# Patient Record
Sex: Female | Born: 1935 | Race: Black or African American | Hispanic: No | Smoking: Former smoker
Health system: Southern US, Community
[De-identification: ages and names within clinical notes are randomized; demographics above are authoritative.]

## PROBLEM LIST (undated history)

## (undated) DIAGNOSIS — D509 Iron deficiency anemia, unspecified: Secondary | ICD-10-CM

## (undated) DIAGNOSIS — I10 Essential (primary) hypertension: Secondary | ICD-10-CM

## (undated) DIAGNOSIS — K5790 Diverticulosis of intestine, part unspecified, without perforation or abscess without bleeding: Secondary | ICD-10-CM

## (undated) DIAGNOSIS — E785 Hyperlipidemia, unspecified: Secondary | ICD-10-CM

## (undated) DIAGNOSIS — K635 Polyp of colon: Secondary | ICD-10-CM

## (undated) HISTORY — DX: Iron deficiency anemia, unspecified: D50.9

## (undated) HISTORY — DX: Polyp of colon: K63.5

## (undated) HISTORY — DX: Diverticulosis of intestine, part unspecified, without perforation or abscess without bleeding: K57.90

---

## 1968-06-28 HISTORY — PX: ABDOMINAL HYSTERECTOMY: SHX81

## 1997-10-25 ENCOUNTER — Ambulatory Visit (HOSPITAL_COMMUNITY): Admission: RE | Admit: 1997-10-25 | Discharge: 1997-10-25 | Payer: Self-pay | Admitting: Endocrinology

## 1997-11-12 ENCOUNTER — Other Ambulatory Visit: Admission: RE | Admit: 1997-11-12 | Discharge: 1997-11-12 | Payer: Self-pay | Admitting: Obstetrics and Gynecology

## 1998-12-01 ENCOUNTER — Other Ambulatory Visit: Admission: RE | Admit: 1998-12-01 | Discharge: 1998-12-01 | Payer: Self-pay | Admitting: Obstetrics and Gynecology

## 1999-09-08 ENCOUNTER — Ambulatory Visit (HOSPITAL_COMMUNITY): Admission: RE | Admit: 1999-09-08 | Discharge: 1999-09-08 | Payer: Self-pay | Admitting: Endocrinology

## 1999-09-08 ENCOUNTER — Encounter: Payer: Self-pay | Admitting: Endocrinology

## 1999-12-07 ENCOUNTER — Other Ambulatory Visit: Admission: RE | Admit: 1999-12-07 | Discharge: 1999-12-07 | Payer: Self-pay | Admitting: Obstetrics and Gynecology

## 2000-09-09 ENCOUNTER — Ambulatory Visit (HOSPITAL_COMMUNITY): Admission: RE | Admit: 2000-09-09 | Discharge: 2000-09-09 | Payer: Self-pay | Admitting: Obstetrics and Gynecology

## 2000-09-09 ENCOUNTER — Encounter: Payer: Self-pay | Admitting: Obstetrics and Gynecology

## 2000-12-05 ENCOUNTER — Other Ambulatory Visit: Admission: RE | Admit: 2000-12-05 | Discharge: 2000-12-05 | Payer: Self-pay | Admitting: Obstetrics and Gynecology

## 2001-07-03 ENCOUNTER — Encounter: Admission: RE | Admit: 2001-07-03 | Discharge: 2001-10-01 | Payer: Self-pay | Admitting: Endocrinology

## 2001-09-11 ENCOUNTER — Encounter: Payer: Self-pay | Admitting: Obstetrics and Gynecology

## 2001-09-11 ENCOUNTER — Ambulatory Visit (HOSPITAL_COMMUNITY): Admission: RE | Admit: 2001-09-11 | Discharge: 2001-09-11 | Payer: Self-pay | Admitting: Obstetrics and Gynecology

## 2001-10-05 ENCOUNTER — Encounter: Admission: RE | Admit: 2001-10-05 | Discharge: 2002-01-03 | Payer: Self-pay | Admitting: Endocrinology

## 2002-04-06 ENCOUNTER — Encounter: Admission: RE | Admit: 2002-04-06 | Discharge: 2002-04-06 | Payer: Self-pay | Admitting: Internal Medicine

## 2002-04-06 ENCOUNTER — Encounter: Payer: Self-pay | Admitting: Internal Medicine

## 2002-09-18 ENCOUNTER — Ambulatory Visit (HOSPITAL_COMMUNITY): Admission: RE | Admit: 2002-09-18 | Discharge: 2002-09-18 | Payer: Self-pay | Admitting: Endocrinology

## 2002-09-18 ENCOUNTER — Encounter: Payer: Self-pay | Admitting: Endocrinology

## 2003-10-01 ENCOUNTER — Ambulatory Visit (HOSPITAL_COMMUNITY): Admission: RE | Admit: 2003-10-01 | Discharge: 2003-10-01 | Payer: Self-pay | Admitting: Endocrinology

## 2004-10-07 ENCOUNTER — Ambulatory Visit (HOSPITAL_COMMUNITY): Admission: RE | Admit: 2004-10-07 | Discharge: 2004-10-07 | Payer: Self-pay | Admitting: Endocrinology

## 2005-10-12 ENCOUNTER — Ambulatory Visit (HOSPITAL_COMMUNITY): Admission: RE | Admit: 2005-10-12 | Discharge: 2005-10-12 | Payer: Self-pay | Admitting: Endocrinology

## 2006-10-18 ENCOUNTER — Ambulatory Visit (HOSPITAL_COMMUNITY): Admission: RE | Admit: 2006-10-18 | Discharge: 2006-10-18 | Payer: Self-pay | Admitting: Endocrinology

## 2007-10-18 ENCOUNTER — Ambulatory Visit (HOSPITAL_COMMUNITY): Admission: RE | Admit: 2007-10-18 | Discharge: 2007-10-18 | Payer: Self-pay | Admitting: Endocrinology

## 2008-10-21 ENCOUNTER — Ambulatory Visit (HOSPITAL_COMMUNITY): Admission: RE | Admit: 2008-10-21 | Discharge: 2008-10-21 | Payer: Self-pay | Admitting: Endocrinology

## 2009-03-11 ENCOUNTER — Encounter (INDEPENDENT_AMBULATORY_CARE_PROVIDER_SITE_OTHER): Payer: Self-pay | Admitting: *Deleted

## 2009-04-21 ENCOUNTER — Encounter (INDEPENDENT_AMBULATORY_CARE_PROVIDER_SITE_OTHER): Payer: Self-pay | Admitting: *Deleted

## 2009-04-30 ENCOUNTER — Ambulatory Visit: Payer: Self-pay | Admitting: Gastroenterology

## 2009-05-14 ENCOUNTER — Ambulatory Visit: Payer: Self-pay | Admitting: Gastroenterology

## 2009-05-21 ENCOUNTER — Encounter: Payer: Self-pay | Admitting: Gastroenterology

## 2009-10-24 ENCOUNTER — Ambulatory Visit (HOSPITAL_COMMUNITY): Admission: RE | Admit: 2009-10-24 | Discharge: 2009-10-24 | Payer: Self-pay | Admitting: Internal Medicine

## 2010-04-23 ENCOUNTER — Ambulatory Visit: Payer: Self-pay | Admitting: Gynecology

## 2010-04-23 ENCOUNTER — Other Ambulatory Visit: Admission: RE | Admit: 2010-04-23 | Discharge: 2010-04-23 | Payer: Self-pay | Admitting: Gynecology

## 2010-06-02 ENCOUNTER — Ambulatory Visit: Payer: Self-pay | Admitting: Gynecology

## 2010-07-19 ENCOUNTER — Encounter: Payer: Self-pay | Admitting: Endocrinology

## 2010-09-30 LAB — GLUCOSE, CAPILLARY: Glucose-Capillary: 81 mg/dL (ref 70–99)

## 2010-10-20 ENCOUNTER — Other Ambulatory Visit (HOSPITAL_COMMUNITY): Payer: Self-pay | Admitting: Internal Medicine

## 2010-10-20 DIAGNOSIS — Z1231 Encounter for screening mammogram for malignant neoplasm of breast: Secondary | ICD-10-CM

## 2010-10-21 ENCOUNTER — Other Ambulatory Visit: Payer: Self-pay | Admitting: Dermatology

## 2010-10-29 ENCOUNTER — Ambulatory Visit (HOSPITAL_COMMUNITY): Payer: Self-pay

## 2010-11-05 ENCOUNTER — Ambulatory Visit (HOSPITAL_COMMUNITY)
Admission: RE | Admit: 2010-11-05 | Discharge: 2010-11-05 | Disposition: A | Discharge status: Home / Self Care | Payer: Medicare Other | Source: Ambulatory Visit | Attending: Internal Medicine | Admitting: Internal Medicine

## 2010-11-05 DIAGNOSIS — Z1231 Encounter for screening mammogram for malignant neoplasm of breast: Secondary | ICD-10-CM

## 2011-08-02 DIAGNOSIS — Z0000 Encounter for general adult medical examination without abnormal findings: Secondary | ICD-10-CM | POA: Insufficient documentation

## 2011-10-04 ENCOUNTER — Other Ambulatory Visit (HOSPITAL_COMMUNITY): Payer: Self-pay | Admitting: Endocrinology

## 2011-10-04 DIAGNOSIS — Z1231 Encounter for screening mammogram for malignant neoplasm of breast: Secondary | ICD-10-CM

## 2011-10-26 ENCOUNTER — Emergency Department (HOSPITAL_COMMUNITY): Payer: Medicare Other

## 2011-10-26 ENCOUNTER — Encounter (HOSPITAL_COMMUNITY): Payer: Self-pay | Admitting: Emergency Medicine

## 2011-10-26 ENCOUNTER — Emergency Department (HOSPITAL_COMMUNITY)
Admission: EM | Admit: 2011-10-26 | Discharge: 2011-10-26 | Disposition: A | Discharge status: Home / Self Care | Payer: Medicare Other | Attending: Emergency Medicine | Admitting: Emergency Medicine

## 2011-10-26 DIAGNOSIS — Z79899 Other long term (current) drug therapy: Secondary | ICD-10-CM | POA: Insufficient documentation

## 2011-10-26 DIAGNOSIS — R51 Headache: Secondary | ICD-10-CM

## 2011-10-26 DIAGNOSIS — I10 Essential (primary) hypertension: Secondary | ICD-10-CM | POA: Insufficient documentation

## 2011-10-26 DIAGNOSIS — E785 Hyperlipidemia, unspecified: Secondary | ICD-10-CM | POA: Insufficient documentation

## 2011-10-26 DIAGNOSIS — E119 Type 2 diabetes mellitus without complications: Secondary | ICD-10-CM | POA: Insufficient documentation

## 2011-10-26 DIAGNOSIS — R6889 Other general symptoms and signs: Secondary | ICD-10-CM | POA: Insufficient documentation

## 2011-10-26 DIAGNOSIS — Z7982 Long term (current) use of aspirin: Secondary | ICD-10-CM | POA: Insufficient documentation

## 2011-10-26 DIAGNOSIS — J3489 Other specified disorders of nose and nasal sinuses: Secondary | ICD-10-CM | POA: Insufficient documentation

## 2011-10-26 HISTORY — DX: Hyperlipidemia, unspecified: E78.5

## 2011-10-26 HISTORY — DX: Essential (primary) hypertension: I10

## 2011-10-26 NOTE — ED Notes (Signed)
Pt states that she has HA over L eyebrow for past 2 weeks.   No sensitivity to light or movmement.  Only thing that helps pain is ibuprofen which works for 5 hours.  Denies eye strain.  States she has had pain over eyebrow before, but it has not lasted this long before.  Went to urgent care and was diagnosed with sinusitis, and was given amoxicillin-clavulanate.  Pt has taken all of abx with no relief.  Talked to PCP.  Was told if unable to get relief with abx, she needed to have a "scan."  Called pcp after abx completed, but has not gotten response from pcp.

## 2011-10-26 NOTE — ED Provider Notes (Signed)
History     CSN: [id number]  Arrival date & time 10/26/11  0712   First MD Initiated Contact with Patient 10/26/11 [phone]      Chief Complaint  Patient presents with  . Headache    (Consider location/radiation/quality/duration/timing/severity/associated sxs/prior treatment) HPI Comments: Maureen Walker is a 78 y.o. Female   who has had a headache for 2 weeks. It is, in the left frontal area. It is mild. She's not taken analgesic medication. Her PCP  gave her an antibiotic. This has not helped. She has sinus congestion in sneezing, without cough, shortness of breath, sputum production, fever, nausea, vomiting, chest pain. She's been eating well.  The history is provided by the patient.    Past Medical History  Diagnosis Date  . Diabetes mellitus   . Hyperlipidemia   . Hypertension     History reviewed. No pertinent past surgical history.  History reviewed. No pertinent family history.  History  Substance Use Topics  . Smoking status: Never Smoker   . Smokeless tobacco: Not on file  . Alcohol Use: No    OB History    Grav Para Term Preterm Abortions TAB SAB Ect Mult Living                  Review of Systems  All other systems reviewed and are negative.    Allergies  Review of patient's allergies indicates no known allergies.  Home Medications   Current Outpatient Rx  Name Route Sig Dispense Refill  . AMOXICILLIN-POT CLAVULANATE 875-125 MG PO TABS Oral Take 1 tablet by mouth 2 (two) times daily. 10 day supply completed on this past friday    . ASPIRIN EC 81 MG PO TBEC Oral Take 81 mg by mouth daily.    . ATORVASTATIN CALCIUM 40 MG PO TABS Oral Take 40 mg by mouth daily.    Marland Kitchen CALCIUM 600/VITAMIN D PO Oral Take 1 tablet by mouth daily.    . CO Q 10 100 MG PO CAPS Oral Take 1 tablet by mouth daily.    Marland Kitchen EZETIMIBE 10 MG PO TABS Oral Take 10 mg by mouth daily.    Marland Kitchen HYDROCHLOROTHIAZIDE 12.5 MG PO CAPS Oral Take 12.5 mg by mouth daily.    . IBUPROFEN 200 MG PO  TABS Oral Take 400 mg by mouth every 6 (six) hours as needed. pain    . METFORMIN HCL ER 750 MG PO TB24 Oral Take 750 mg by mouth daily with breakfast.    . ADULT MULTIVITAMIN W/MINERALS CH Oral Take 1 tablet by mouth daily.    Marland Kitchen PIOGLITAZONE HCL 45 MG PO TABS Oral Take 45 mg by mouth daily.    . QUINAPRIL HCL 40 MG PO TABS Oral Take 40 mg by mouth daily.      BP 145/84  Pulse 70  Temp(Src) 98.1 F (36.7 C) (Oral)  Resp 18  SpO2 96%  Physical Exam  Nursing note and vitals reviewed. Constitutional: She is oriented to person, place, and time. She appears well-developed and well-nourished.  HENT:  Head: Normocephalic and atraumatic.  Eyes: Conjunctivae and EOM are normal. Pupils are equal, round, and reactive to light.  Neck: Normal range of motion and phonation normal. Neck supple.  Cardiovascular: Normal rate, regular rhythm and intact distal pulses.   Pulmonary/Chest: Effort normal and breath sounds normal. She exhibits no tenderness.  Abdominal: Soft. She exhibits no distension. There is no tenderness. There is no guarding.  Musculoskeletal: Normal range of motion.  Neurological: She is alert and oriented to person, place, and time. She has normal strength. No cranial nerve deficit. She exhibits normal muscle tone. Coordination normal.       Normal gait  Skin: Skin is warm and dry.  Psychiatric: She has a normal mood and affect. Her behavior is normal. Judgment and thought content normal.    ED Course  Procedures (including critical care time)  Labs Reviewed - No data to display Ct Head Wo Contrast  10/26/2011  *RADIOLOGY REPORT*  Clinical Data: The history of a frontal headache for the past 2 weeks.  No history of trauma.  CT HEAD WITHOUT CONTRAST  Technique:  Contiguous axial images were obtained from the base of the skull through the vertex without contrast.  Comparison: No priors.  Findings: No acute intracranial abnormalities.  Specifically, no evidence of focal mass, mass  effect, hydrocephalus or abnormal intra or extra-axial fluid collections.  There is mild cerebral and cerebellar atrophy.  No acute displaced skull fractures are identified.  Visualized paranasal sinuses and mastoids are well pneumatized.  IMPRESSION: 1.  No acute intracranial abnormalities to account for the patient's symptoms. 2.  Mild cerebral and cerebellar atrophy.  Original Report Authenticated By: Florencia Reasons, M.D.     1. Headache       MDM  Nonspecific headache. Possible sinus congestion, tension headache or nonspecific myalgia. Patient stable for discharge. Doubt meningitis, subarachnoid hemorrhage, acute, recurrent sinusitis or metabolic instability.  Plan: Home Medications- Advil prn pain; Home Treatments- Rest; Recommended follow up- PCP prn        Flint Melter, MD 10/26/11 [phone]

## 2011-10-26 NOTE — Discharge Instructions (Signed)
  Use Advil, 3 times a day as needed for headache. See your doctor if not better in one week.  Headache, General, Unknown Cause The specific cause of your headache may not have been found today. There are many causes and types of headache. A few common ones are:  Tension headache.   Migraine.   Infections (examples: dental and sinus infections).   Bone and/or joint problems in the neck or jaw.   Depression.   Eye problems.  These headaches are not life threatening.  Headaches can sometimes be diagnosed by a patient history and a physical exam. Sometimes, lab and imaging studies (such as x-ray and/or CT scan) are used to rule out more serious problems. In some cases, a spinal tap (lumbar puncture) may be requested. There are many times when your exam and tests may be normal on the first visit even when there is a serious problem causing your headaches. Because of that, it is very important to follow up with your doctor or local clinic for further evaluation. FINDING OUT THE RESULTS OF TESTS  If a radiology test was performed, a radiologist will review your results.   You will be contacted by the emergency department or your physician if any test results require a change in your treatment plan.   Not all test results may be available during your visit. If your test results are not back during the visit, make an appointment with your caregiver to find out the results. Do not assume everything is normal if you have not heard from your caregiver or the medical facility. It is important for you to follow up on all of your test results.  HOME CARE INSTRUCTIONS   Keep follow-up appointments with your caregiver, or any specialist referral.   Only take over-the-counter or prescription medicines for pain, discomfort, or fever as directed by your caregiver.   Biofeedback, massage, or other relaxation techniques may be helpful.   Ice packs or heat applied to the head and neck can be used. Do this  three to four times per day, or as needed.   Call your doctor if you have any questions or concerns.   If you smoke, you should quit.  SEEK MEDICAL CARE IF:   You develop problems with medications prescribed.   You do not respond to or obtain relief from medications.   You have a change from the usual headache.   You develop nausea or vomiting.  SEEK IMMEDIATE MEDICAL CARE IF:   If your headache becomes severe.   You have an unexplained oral temperature above 102 F (38.9 C), or as your caregiver suggests.   You have a stiff neck.   You have loss of vision.   You have muscular weakness.   You have loss of muscular control.   You develop severe symptoms different from your first symptoms.   You start losing your balance or have trouble walking.   You feel faint or pass out.  MAKE SURE YOU:   Understand these instructions.   Will watch your condition.   Will get help right away if you are not doing well or get worse.  Document Released: 06/14/2005 Document Revised: 06/03/2011 Document Reviewed: 02/01/2008 [facility] Patient Information 2012 [place], [place].

## 2011-11-11 ENCOUNTER — Ambulatory Visit (HOSPITAL_COMMUNITY): Payer: Medicare Other

## 2011-12-01 ENCOUNTER — Ambulatory Visit (HOSPITAL_COMMUNITY)
Admission: RE | Admit: 2011-12-01 | Discharge: 2011-12-01 | Disposition: A | Discharge status: Home / Self Care | Payer: Medicare Other | Source: Ambulatory Visit | Attending: Endocrinology | Admitting: Endocrinology

## 2011-12-01 DIAGNOSIS — Z1231 Encounter for screening mammogram for malignant neoplasm of breast: Secondary | ICD-10-CM | POA: Insufficient documentation

## 2011-12-27 ENCOUNTER — Encounter (HOSPITAL_COMMUNITY): Payer: Self-pay | Admitting: *Deleted

## 2011-12-27 ENCOUNTER — Emergency Department (HOSPITAL_COMMUNITY)
Admission: EM | Admit: 2011-12-27 | Discharge: 2011-12-27 | Disposition: A | Discharge status: Home / Self Care | Payer: Medicare Other | Attending: Emergency Medicine | Admitting: Emergency Medicine

## 2011-12-27 ENCOUNTER — Emergency Department (HOSPITAL_COMMUNITY): Payer: Medicare Other

## 2011-12-27 DIAGNOSIS — E785 Hyperlipidemia, unspecified: Secondary | ICD-10-CM | POA: Insufficient documentation

## 2011-12-27 DIAGNOSIS — Z7982 Long term (current) use of aspirin: Secondary | ICD-10-CM | POA: Insufficient documentation

## 2011-12-27 DIAGNOSIS — E119 Type 2 diabetes mellitus without complications: Secondary | ICD-10-CM | POA: Insufficient documentation

## 2011-12-27 DIAGNOSIS — I10 Essential (primary) hypertension: Secondary | ICD-10-CM | POA: Insufficient documentation

## 2011-12-27 DIAGNOSIS — S91312A Laceration without foreign body, left foot, initial encounter: Secondary | ICD-10-CM

## 2011-12-27 DIAGNOSIS — W208XXA Other cause of strike by thrown, projected or falling object, initial encounter: Secondary | ICD-10-CM | POA: Insufficient documentation

## 2011-12-27 DIAGNOSIS — Z79899 Other long term (current) drug therapy: Secondary | ICD-10-CM | POA: Insufficient documentation

## 2011-12-27 DIAGNOSIS — S91309A Unspecified open wound, unspecified foot, initial encounter: Secondary | ICD-10-CM | POA: Insufficient documentation

## 2011-12-27 LAB — GLUCOSE, CAPILLARY: Glucose-Capillary: 125 mg/dL — ABNORMAL HIGH (ref 70–99)

## 2011-12-27 MED ORDER — TETANUS-DIPHTH-ACELL PERTUSSIS 5-2.5-18.5 LF-MCG/0.5 IM SUSP
0.5000 mL | Freq: Once | INTRAMUSCULAR | Status: AC
  Administered 2011-12-27: 0.5 mL via INTRAMUSCULAR
  Filled 2011-12-27: qty 0.5

## 2011-12-27 NOTE — Discharge Instructions (Signed)
Your wound has been repaired with sutures. Please keep the area clean and dry. You can wash the area as normal but do not scrub at the stitches. Your sutures will need to be removed in 8-10 days. You can follow up with your primary care doctor, at urgent care, or in the ED for this. When the stitches come out, apply vitamin E, cocoa butter, or Mederma to help with scarring. Use extra sunscreen on the scar when out in the sun to avoid the scar darkening. If you notice pus from the wound, redness of the surrounding skin, fever, swelling, or other concerns about worsening condition, return to the ED.  Laceration Care, Adult A laceration is a cut that goes through all layers of the skin. The cut goes into the tissue beneath the skin. HOME CARE For stitches (sutures) or staples:  Keep the cut clean and dry.   If you have a bandage (dressing), change it at least once a day. Change the bandage if it gets wet or dirty, or as told by your doctor.   Wash the cut with soap and water 2 times a day. Rinse the cut with water. Pat it dry with a clean towel.   Put a thin layer of medicated cream on the cut as told by your doctor.   You may shower after the first 24 hours. Do not soak the cut in water until the stitches are removed.   Only take medicines as told by your doctor.   Have your stitches or staples removed as told by your doctor.  For skin adhesive strips:  Keep the cut clean and dry.   Do not get the strips wet. You may take a bath, but be careful to keep the cut dry.   If the cut gets wet, pat it dry with a clean towel.   The strips will fall off on their own. Do not remove the strips that are still stuck to the cut.  For wound glue:  You may shower or take baths. Do not soak or scrub the cut. Do not swim. Avoid heavy sweating until the glue falls off on its own. After a shower or bath, pat the cut dry with a clean towel.   Do not put medicine on your cut until the glue falls off.   If  you have a bandage, do not put tape over the glue.   Avoid lots of sunlight or tanning lamps until the glue falls off. Put sunscreen on the cut for the first year to reduce your scar.   The glue will fall off on its own. Do not pick at the glue.  You may need a tetanus shot if:  You cannot remember when you had your last tetanus shot.   You have never had a tetanus shot.  If you need a tetanus shot and you choose not to have one, you may get tetanus. Sickness from tetanus can be serious. GET HELP RIGHT AWAY IF:   Your pain does not get better with medicine.   Your arm, hand, leg, or foot loses feeling (numbness) or changes color.   Your cut is bleeding.   Your joint feels weak, or you cannot use your joint.   You have painful lumps on your body.   Your cut is red, puffy (swollen), or painful.   You have a red line on the skin near the cut.   You have yellowish-white fluid (pus) coming from the cut.   You  have a fever.   You have a bad smell coming from the cut or bandage.   Your cut breaks open before or after stitches are removed.   You notice something coming out of the cut, such as wood or glass.   You cannot move a finger or toe.  MAKE SURE YOU:   Understand these instructions.   Will watch your condition.   Will get help right away if you are not doing well or get worse.  Document Released: 12/01/2007 Document Revised: 06/03/2011 Document Reviewed: 12/08/2010 [facility] Patient Information 2012 [place], [place].

## 2011-12-27 NOTE — ED Notes (Signed)
Pt reports heavy wooden board fell on left foot today. Laceration present, bleeding controlled prior to arrival. States metal on board cut foot. Pt reports last tetanus 6-7 years ago. Denies pain, states extremity "sore".

## 2011-12-27 NOTE — ED Provider Notes (Signed)
Medical screening examination/treatment/procedure(s) were conducted as a shared visit with non-physician practitioner(s) and myself.  I personally evaluated the patient during the encounter  Pt tolerated her procedure well.  She has no concerns at this time.  Will dc home.  Instructed her to monitor for signs of infection.  Prophylactic abx not necessary at this time.  Celene Kras, MD 12/27/11 2027

## 2011-12-27 NOTE — ED Provider Notes (Signed)
History     CSN: [id number]  Arrival date & time 12/27/11  1656   First MD Initiated Contact with Patient 12/27/11 1910      Chief Complaint  Patient presents with  . Extremity Laceration    (Consider location/radiation/quality/duration/timing/severity/associated sxs/prior treatment) HPI Comments: Pt reports a heavy piece of lumber fell against her left leg today. She suffered 2 lacerations to the dorsum of her L foot and an abrasion down her L thigh. She was able to control bleeding well at home with dressing; not on any anticoagulants. Hx DM but BGs well controlled, last A1c in low 6s. Last tet ~7 years ago.  Patient is a 78 y.o. female presenting with skin laceration. The history is provided by the patient.  Laceration  The incident occurred 3 to 5 hours ago. The laceration is located on the left foot. The laceration is 2 cm in size. The pain is mild. She reports no foreign bodies present.    Past Medical History  Diagnosis Date  . Diabetes mellitus   . Hyperlipidemia   . Hypertension     History reviewed. No pertinent past surgical history.  No family history on file.  History  Substance Use Topics  . Smoking status: Never Smoker   . Smokeless tobacco: Not on file  . Alcohol Use: No    OB History    Grav Para Term Preterm Abortions TAB SAB Ect Mult Living                  Review of Systems  Constitutional: Negative.   Musculoskeletal: Positive for myalgias.  Skin: Positive for wound.  Neurological: Negative for weakness and numbness.  Hematological: Does not bruise/bleed easily.    Allergies  Review of patient's allergies indicates no known allergies.  Home Medications   Current Outpatient Rx  Name Route Sig Dispense Refill  . ASPIRIN EC 81 MG PO TBEC Oral Take 81 mg by mouth daily.    . ATORVASTATIN CALCIUM 40 MG PO TABS Oral Take 40 mg by mouth daily.    Marland Kitchen CALCIUM 600/VITAMIN D PO Oral Take 1 tablet by mouth daily.    . CO Q 10 100 MG PO CAPS Oral  Take 1 tablet by mouth daily.    Marland Kitchen EZETIMIBE 10 MG PO TABS Oral Take 10 mg by mouth daily.    Marland Kitchen GABAPENTIN 300 MG PO CAPS Oral Take 300 mg by mouth at bedtime. May step up to bid if needed.    Marland Kitchen HYDROCHLOROTHIAZIDE 12.5 MG PO CAPS Oral Take 12.5 mg by mouth daily.    Marland Kitchen METFORMIN HCL ER 750 MG PO TB24 Oral Take 750 mg by mouth 2 (two) times daily.     . ADULT MULTIVITAMIN W/MINERALS CH Oral Take 1 tablet by mouth daily.    Marland Kitchen PIOGLITAZONE HCL 45 MG PO TABS Oral Take 45 mg by mouth daily.    . QUINAPRIL HCL 40 MG PO TABS Oral Take 40 mg by mouth daily.      BP 130/57  Pulse 67  Temp 98.3 F (36.8 C) (Oral)  Resp 16  SpO2 97%  Physical Exam  Nursing note and vitals reviewed. Constitutional: She appears well-developed and well-nourished. No distress.  HENT:  Head: Normocephalic and atraumatic.  Neck: Normal range of motion.  Cardiovascular: Normal rate.   Pulmonary/Chest: Effort normal.  Musculoskeletal: Normal range of motion.       Feet:       Skin tear to lat dorsum of  L foot, skin tear to medial dorsum with ~2 to 2.5 cm portion with deeper laceration; wound clean appearing, bleeding well controlled. Foot NVI distally with good pedal pulses. Cap refill <2.  Superficial vertical abrasion down L thigh, no active bleeding  Neurological: She is alert.  Skin: Skin is warm and dry. She is not diaphoretic.  Psychiatric: She has a normal mood and affect.    ED Course  Procedures (including critical care time)  LACERATION REPAIR Performed by: Grant Fontana Authorized by: Grant Fontana Consent: Verbal consent obtained. Risks and benefits: risks, benefits and alternatives were discussed Consent given by: patient Patient identity confirmed: provided demographic data Prepped and Draped in normal sterile fashion Wound explored  Laceration Location: L medial dorsum of foot  Laceration Length: 2.5cm  No Foreign Bodies seen or palpated  Anesthesia: local  infiltration  Local anesthetic: lidocaine 2% without epinephrine  Anesthetic total: 5 ml  Irrigation method: syringe Amount of cleaning: standard  Skin closure: 5-0 prolene  Number of sutures: 6  Technique: simple interrupted  Patient tolerance: Patient tolerated the procedure well with no immediate complications.  Labs Reviewed  GLUCOSE, CAPILLARY - Abnormal; Notable for the following:    Glucose-Capillary 125 (*)     All other components within normal limits   Dg Foot Complete Left  12/27/2011  *RADIOLOGY REPORT*  Clinical Data: Fall, foot laceration, pain.  LEFT FOOT - COMPLETE 3+ VIEW  Comparison: None.  Findings: Soft tissue swelling and laceration along the dorsum of the foot.  No radiopaque foreign body.  No acute bony abnormality. No fracture, subluxation or dislocation.  IMPRESSION: Dorsal soft tissue laceration.  No acute bony abnormality or radiopaque foreign body.  Original Report Authenticated By: Cyndie Chime, M.D.     1. Laceration of left foot       MDM  Pt with laceration to foot after a board fell on it. Lat lac appears to be a skin tear, medial lac is deeper and requires sutures. Wound cleaned well and sutures placed which pt tolerated well. Tet updated. Pt instructed to f/u with pcp to have sutures removed. Wound care discussed. She verbalized understanding and agreed to plan.        Grant Fontana, PA-C 12/27/11 2048

## 2012-01-03 ENCOUNTER — Emergency Department (HOSPITAL_COMMUNITY)
Admission: EM | Admit: 2012-01-03 | Discharge: 2012-01-03 | Disposition: A | Discharge status: Home / Self Care | Payer: Medicare Other | Attending: Emergency Medicine | Admitting: Emergency Medicine

## 2012-01-03 ENCOUNTER — Encounter (HOSPITAL_COMMUNITY): Payer: Self-pay | Admitting: Emergency Medicine

## 2012-01-03 DIAGNOSIS — Z79899 Other long term (current) drug therapy: Secondary | ICD-10-CM | POA: Insufficient documentation

## 2012-01-03 DIAGNOSIS — IMO0002 Reserved for concepts with insufficient information to code with codable children: Secondary | ICD-10-CM

## 2012-01-03 DIAGNOSIS — M7989 Other specified soft tissue disorders: Secondary | ICD-10-CM | POA: Insufficient documentation

## 2012-01-03 DIAGNOSIS — E785 Hyperlipidemia, unspecified: Secondary | ICD-10-CM | POA: Insufficient documentation

## 2012-01-03 DIAGNOSIS — E119 Type 2 diabetes mellitus without complications: Secondary | ICD-10-CM | POA: Insufficient documentation

## 2012-01-03 DIAGNOSIS — Z7982 Long term (current) use of aspirin: Secondary | ICD-10-CM | POA: Insufficient documentation

## 2012-01-03 DIAGNOSIS — I10 Essential (primary) hypertension: Secondary | ICD-10-CM | POA: Insufficient documentation

## 2012-01-03 LAB — BASIC METABOLIC PANEL
CO2: 29 mEq/L (ref 19–32)
Calcium: 9.8 mg/dL (ref 8.4–10.5)
Creatinine, Ser: 0.8 mg/dL (ref 0.50–1.10)
GFR calc Af Amer: 80 mL/min — ABNORMAL LOW (ref >90)
GFR calc non Af Amer: 69 mL/min — ABNORMAL LOW (ref >90)
Sodium: 131 mEq/L — ABNORMAL LOW (ref 135–145)

## 2012-01-03 LAB — CBC
MCH: 24.4 pg — ABNORMAL LOW (ref 26.0–34.0)
Platelets: 353 10*3/uL (ref 150–400)
RBC: 5.25 MIL/uL — ABNORMAL HIGH (ref 3.87–5.11)
RDW: 14.2 % (ref 11.5–15.5)

## 2012-01-03 LAB — GLUCOSE, CAPILLARY: Glucose-Capillary: 112 mg/dL — ABNORMAL HIGH (ref 70–99)

## 2012-01-03 MED ORDER — CEPHALEXIN 500 MG PO CAPS: 500.0000 mg | ORAL_CAPSULE | Freq: Three times a day (TID) | ORAL | Status: AC

## 2012-01-03 MED ORDER — CEFAZOLIN SODIUM 1-5 GM-% IV SOLN
1.0000 g | Freq: Once | INTRAVENOUS | Status: AC
  Administered 2012-01-03: 1 g via INTRAVENOUS
  Filled 2012-01-03: qty 50

## 2012-01-03 MED ORDER — BACITRACIN ZINC 500 UNIT/GM EX OINT
TOPICAL_OINTMENT | CUTANEOUS | Status: AC
  Filled 2012-01-03: qty 1.8

## 2012-01-03 NOTE — ED Notes (Signed)
Diabetic patient with wound to left foot.  Pt had six stitches put in her foot on Monday, after a board fell on her foot.  She returns now with redness and swelling to the area.

## 2012-01-03 NOTE — ED Notes (Signed)
Pt presenting to ed with c/o left foot with swelling and soreness pt states she noticed pus on her left foot pt states she was told to present back to ed for any pus. Pt states she is diabetic and was concerned. Pt states she has had the redness since she cut her foot. Pt states she was seen here in the ED when she first injured her foot x 7 days ago

## 2012-01-03 NOTE — ED Provider Notes (Signed)
History     CSN: [id number]  Arrival date & time 01/03/12  1548   First MD Initiated Contact with Patient 01/03/12 1717      Chief Complaint  Patient presents with  . Foot Problem   HPI Pt sustained a laceration to her foot last week.  She has been keeping a close watch of it because of her diabetes.  Today she noticed it looks more swollen and she thought she noticed a little bit of yellow drainage around the non sutured wound.  She has not had any pain or fevers.    No vomiting or diarrhea.  No other complaints.  The symptoms are mild.  Past Medical History  Diagnosis Date  . Diabetes mellitus   . Hyperlipidemia   . Hypertension     History reviewed. No pertinent past surgical history.  No family history on file.  History  Substance Use Topics  . Smoking status: Never Smoker   . Smokeless tobacco: Not on file  . Alcohol Use: No    OB History    Grav Para Term Preterm Abortions TAB SAB Ect Mult Living                  Review of Systems  All other systems reviewed and are negative.    Allergies  Review of patient's allergies indicates no known allergies.  Home Medications   Current Outpatient Rx  Name Route Sig Dispense Refill  . ASPIRIN EC 81 MG PO TBEC Oral Take 81 mg by mouth daily.    . ATORVASTATIN CALCIUM 40 MG PO TABS Oral Take 40 mg by mouth daily.    Marland Kitchen CALCIUM 600/VITAMIN D PO Oral Take 1 tablet by mouth daily.    . CO Q 10 100 MG PO CAPS Oral Take 1 tablet by mouth daily.    Marland Kitchen EZETIMIBE 10 MG PO TABS Oral Take 10 mg by mouth daily.    Marland Kitchen GABAPENTIN 300 MG PO CAPS Oral Take 300 mg by mouth at bedtime. May step up to bid if needed.    Marland Kitchen HYDROCHLOROTHIAZIDE 12.5 MG PO CAPS Oral Take 12.5 mg by mouth daily.    Marland Kitchen METFORMIN HCL ER 750 MG PO TB24 Oral Take 750 mg by mouth 2 (two) times daily.     . ADULT MULTIVITAMIN W/MINERALS CH Oral Take 1 tablet by mouth daily.    Marland Kitchen PIOGLITAZONE HCL 45 MG PO TABS Oral Take 45 mg by mouth daily.    . QUINAPRIL HCL 40 MG  PO TABS Oral Take 40 mg by mouth daily.      BP 128/60  Pulse 71  Temp 99.2 F (37.3 C) (Oral)  Resp 20  SpO2 98%  Physical Exam  Nursing note and vitals reviewed. Constitutional: She appears well-developed and well-nourished. No distress.  HENT:  Head: Normocephalic and atraumatic.  Right Ear: External ear normal.  Left Ear: External ear normal.  Eyes: Conjunctivae are normal. Right eye exhibits no discharge. Left eye exhibits no discharge. No scleral icterus.  Neck: Neck supple. No tracheal deviation present.  Cardiovascular: Normal rate, regular rhythm and intact distal pulses.   Pulmonary/Chest: Effort normal and breath sounds normal. No stridor. No respiratory distress. She has no wheezes. She has no rales.  Abdominal: Soft. Bowel sounds are normal. She exhibits no distension. There is no tenderness. There is no rebound and no guarding.  Musculoskeletal: She exhibits no edema and no tenderness.       Left foot with two  wounds, sutured wound with mild erythema, no ttp, no drainage, more proximal wound with granulation tissue at base and one are of white subcutaneous tissue, no purulent drainage, mild erythema around the wound  Neurological: She is alert. She has normal strength. No sensory deficit. Cranial nerve deficit:  no gross defecits noted. She exhibits normal muscle tone. She displays no seizure activity. Coordination normal.  Skin: Skin is warm and dry. No rash noted.  Psychiatric: She has a normal mood and affect.    ED Course  Procedures (including critical care time)  Labs Reviewed  GLUCOSE, CAPILLARY - Abnormal; Notable for the following:    Glucose-Capillary 112 (*)     All other components within normal limits  CBC - Abnormal; Notable for the following:    RBC 5.25 (*)     MCV 73.1 (*)     MCH 24.4 (*)     All other components within normal limits  BASIC METABOLIC PANEL - Abnormal; Notable for the following:    Sodium 131 (*)     Chloride 94 (*)     GFR  calc non Af Amer 69 (*)     GFR calc Af Amer 80 (*)     All other components within normal limits   No results found.   1. Laceration     MDM  I do not think that she definitely has cellulitis based on her exam. However, she has noticed some increased swelling. I will start her on a course of antibiotics. She has plans to followup with her doctor on Thursday. For now I told her we will keep the sutures in but she should monitor the area closely. If she notices increasing redness or streaking going up her leg she should return to the emergency room for evaluation.       Celene Kras, MD 01/03/12 1900

## 2012-04-10 ENCOUNTER — Encounter: Payer: Self-pay | Admitting: Gastroenterology

## 2012-05-10 ENCOUNTER — Other Ambulatory Visit (INDEPENDENT_AMBULATORY_CARE_PROVIDER_SITE_OTHER): Payer: Medicare Other

## 2012-05-10 ENCOUNTER — Encounter: Payer: Self-pay | Admitting: Gastroenterology

## 2012-05-10 ENCOUNTER — Ambulatory Visit (INDEPENDENT_AMBULATORY_CARE_PROVIDER_SITE_OTHER): Payer: Medicare Other | Admitting: Gastroenterology

## 2012-05-10 VITALS — BP 126/60 | HR 80 | Ht 65.0 in | Wt 180.2 lb

## 2012-05-10 DIAGNOSIS — K625 Hemorrhage of anus and rectum: Secondary | ICD-10-CM

## 2012-05-10 DIAGNOSIS — D649 Anemia, unspecified: Secondary | ICD-10-CM | POA: Insufficient documentation

## 2012-05-10 DIAGNOSIS — K921 Melena: Secondary | ICD-10-CM

## 2012-05-10 LAB — CBC WITH DIFFERENTIAL/PLATELET
Basophils Relative: 1.2 % (ref 0.0–3.0)
Eosinophils Relative: 1.8 % (ref 0.0–5.0)
Lymphocytes Relative: 29.8 % (ref 12.0–46.0)
MCV: 76.7 fl — ABNORMAL LOW (ref 78.0–100.0)
Monocytes Relative: 10.3 % (ref 3.0–12.0)
Neutrophils Relative %: 56.9 % (ref 43.0–77.0)
Platelets: 290 10*3/uL (ref 150.0–400.0)
RBC: 5.12 Mil/uL — ABNORMAL HIGH (ref 3.87–5.11)
WBC: 6.6 10*3/uL (ref 4.5–10.5)

## 2012-05-10 LAB — FERRITIN: Ferritin: 19.6 ng/mL (ref 10.0–291.0)

## 2012-05-10 LAB — IBC PANEL
Iron: 54 ug/dL (ref 42–145)
Saturation Ratios: 12.4 % — ABNORMAL LOW (ref 20.0–50.0)
Transferrin: 311.7 mg/dL (ref 212.0–360.0)

## 2012-05-10 NOTE — Patient Instructions (Addendum)
You will go to the basement today for labs You will be given a hemoccult kit that you will need to return within 2 weeks

## 2012-05-10 NOTE — Assessment & Plan Note (Signed)
Very limited rectal bleeding is most likely related to hemorrhoidal disease. Colonoscopy 2010 was negative except for diverticulosis and a hyperplastic polyp.  Recommendations #1 followup Hemoccults

## 2012-05-10 NOTE — Assessment & Plan Note (Signed)
She has a mild microcytic anemia.  Recommendations #1 check iron, TIBC and ferritin levels

## 2012-05-10 NOTE — Progress Notes (Signed)
History of Present Illness: This is a 78 year old Afro-American female referred at the request of Dr. Allyne Gee for evaluation of rectal bleeding. On a few occasions Maureen Walker has noticed a small amount of blood on the toilet tissue. Maureen Walker denies change in bowel habits, abdominal or rectal pain. Colooscopy in 2010 demonstrated a small hyperplastic polyp.  Blood work is pertinent for a hemoglobin of 12.8 and MCV 73.1 in July, 2013. Maureen Walker has a remote history of adenomatous polyps.    Past Medical History  Diagnosis Date  . Diabetes mellitus   . Hyperlipidemia   . Hypertension   . Diverticulosis   . Colon polyps     adenomatous polyps   Past Surgical History  Procedure Date  . Abdominal hysterectomy 1970   family history includes Breast cancer in her maternal aunt; Diabetes in her mother and sister; Heart disease in her paternal aunt; Melanoma in her father; and Stroke in her paternal grandmother. Current Outpatient Prescriptions  Medication Sig Dispense Refill  . aspirin EC 81 MG tablet Take 81 mg by mouth daily.      Marland Kitchen atorvastatin (LIPITOR) 40 MG tablet Take 40 mg by mouth daily.      . Calcium Carbonate-Vitamin D (CALCIUM 600/VITAMIN D PO) Take 1 tablet by mouth daily.      . Coenzyme Q10 (CO Q 10) 100 MG CAPS Take 1 tablet by mouth daily.      Marland Kitchen ezetimibe (ZETIA) 10 MG tablet Take 10 mg by mouth daily.      . hydrochlorothiazide (MICROZIDE) 12.5 MG capsule Take 12.5 mg by mouth daily.      . metFORMIN (GLUCOPHAGE-XR) 750 MG 24 hr tablet Take 750 mg by mouth 2 (two) times daily.       . Multiple Vitamin (MULITIVITAMIN WITH MINERALS) TABS Take 1 tablet by mouth daily.      . pioglitazone (ACTOS) 45 MG tablet Take 45 mg by mouth daily.      . quinapril (ACCUPRIL) 40 MG tablet Take 40 mg by mouth daily.       Allergies as of 05/10/2012  . (No Known Allergies)    reports that Maureen Walker has quit smoking. Her smoking use included Cigarettes. Maureen Walker has never used smokeless tobacco. Maureen Walker reports that Maureen Walker does  not drink alcohol or use illicit drugs.     Review of Systems: Pertinent positive and negative review of systems were noted in the above HPI section. All other review of systems were otherwise negative.  Vital signs were reviewed in today's medical record Physical Exam: General: Well developed , well nourished, no acute distress Head: Normocephalic and atraumatic Eyes:  sclerae anicteric, EOMI Ears: Normal auditory acuity Mouth: No deformity or lesions Neck: Supple, no masses or thyromegaly Lungs: Clear throughout to auscultation Heart: Regular rate and rhythm; no murmurs, rubs or bruits Abdomen: Soft, non tender and non distended. No masses, hepatosplenomegaly or hernias noted. Normal Bowel sounds Rectal: There are small external skin tags Musculoskeletal: Symmetrical with no gross deformities  Skin: No lesions on visible extremities Pulses:  Normal pulses noted Extremities: No clubbing, cyanosis, edema or deformities noted Neurological: Alert oriented x 4, grossly nonfocal Cervical Nodes:  No significant cervical adenopathy Inguinal Nodes: No significant inguinal adenopathy Psychological:  Alert and cooperative. Normal mood and affect

## 2012-05-16 ENCOUNTER — Other Ambulatory Visit (INDEPENDENT_AMBULATORY_CARE_PROVIDER_SITE_OTHER): Payer: Medicare Other

## 2012-05-16 DIAGNOSIS — K921 Melena: Secondary | ICD-10-CM

## 2012-05-16 LAB — FECAL OCCULT BLOOD, IMMUNOCHEMICAL: Fecal Occult Bld: NEGATIVE

## 2012-10-30 ENCOUNTER — Other Ambulatory Visit (HOSPITAL_COMMUNITY): Payer: Self-pay | Admitting: Endocrinology

## 2012-10-30 DIAGNOSIS — Z1231 Encounter for screening mammogram for malignant neoplasm of breast: Secondary | ICD-10-CM

## 2012-12-01 ENCOUNTER — Ambulatory Visit (HOSPITAL_COMMUNITY)
Admission: RE | Admit: 2012-12-01 | Discharge: 2012-12-01 | Disposition: A | Discharge status: Home / Self Care | Payer: Medicare PPO | Source: Ambulatory Visit | Attending: Endocrinology | Admitting: Endocrinology

## 2012-12-01 DIAGNOSIS — Z1231 Encounter for screening mammogram for malignant neoplasm of breast: Secondary | ICD-10-CM

## 2013-01-11 ENCOUNTER — Encounter: Payer: Self-pay | Admitting: Gynecology

## 2013-02-20 ENCOUNTER — Encounter: Payer: Self-pay | Admitting: Gynecology

## 2013-04-04 ENCOUNTER — Ambulatory Visit (INDEPENDENT_AMBULATORY_CARE_PROVIDER_SITE_OTHER): Payer: Medicare PPO | Admitting: Gynecology

## 2013-04-04 ENCOUNTER — Encounter: Payer: Self-pay | Admitting: Gynecology

## 2013-04-04 VITALS — BP 110/70 | Ht 64.75 in | Wt 179.8 lb

## 2013-04-04 DIAGNOSIS — N952 Postmenopausal atrophic vaginitis: Secondary | ICD-10-CM

## 2013-04-04 DIAGNOSIS — Z23 Encounter for immunization: Secondary | ICD-10-CM

## 2013-04-04 DIAGNOSIS — Z780 Asymptomatic menopausal state: Secondary | ICD-10-CM

## 2013-04-04 DIAGNOSIS — D239 Other benign neoplasm of skin, unspecified: Secondary | ICD-10-CM

## 2013-04-04 DIAGNOSIS — D229 Melanocytic nevi, unspecified: Secondary | ICD-10-CM

## 2013-04-04 DIAGNOSIS — N951 Menopausal and female climacteric states: Secondary | ICD-10-CM

## 2013-04-04 NOTE — Progress Notes (Signed)
Maureen Walker 21-Sep-1932 [id number]   History:    79 y.o.  Presents to the office for followup. Patient has not been seen in the office since 2011. Her PCP is Dr. Velna Hatchet and her endocrinologist at Dr. Everlean Patterson. Patient interested in a flu vaccine. Patient also complaining of a pigmented mole on the upper middle aspect of her left arm. She states has gotten a little bigger darker the past 2 years. Review of her records indicated she had a TAH RSO in 1970 as a result of a leiomyomatous uteri. Patient denies any prior history of abnormal Pap smear. Last bone density study 2011. Mammogram this year normal. Her Tdap and shingles vaccine are up to date. Her colonoscopy was reported to have been done in 2010 patient with prior history of colon polyps.    Past medical history,surgical history, family history and social history were all reviewed and documented in the EPIC chart.  Gynecologic History No LMP recorded. Patient has had a hysterectomy. Contraception: status post hysterectomy Last Pap: over 10 years ago. Results were: normal Last mammogram: 2014. Results were: normal  Obstetric History OB History  Gravida Para Term Preterm AB SAB TAB Ectopic Multiple Living  1    1 1     0    # Outcome Date GA Lbr Len/2nd Weight Sex Delivery Anes PTL Lv  1 SAB                ROS: A ROS was performed and pertinent positives and negatives are included in the history.  GENERAL: No fevers or chills. HEENT: No change in vision, no earache, sore throat or sinus congestion. NECK: No pain or stiffness. CARDIOVASCULAR: No chest pain or pressure. No palpitations. PULMONARY: No shortness of breath, cough or wheeze. GASTROINTESTINAL: No abdominal pain, nausea, vomiting or diarrhea, melena or bright red blood per rectum. GENITOURINARY: No urinary frequency, urgency, hesitancy or dysuria. MUSCULOSKELETAL: No joint or muscle pain, no back pain, no recent trauma. DERMATOLOGIC: No rash, no itching, no lesions.  ENDOCRINE: No polyuria, polydipsia, no heat or cold intolerance. No recent change in weight. HEMATOLOGICAL: No anemia or easy bruising or bleeding. NEUROLOGIC: No headache, seizures, numbness, tingling or weakness. PSYCHIATRIC: No depression, no loss of interest in normal activity or change in sleep pattern.     Exam: chaperone present  BP 110/70  Ht 5' 4.75" (1.645 m)  Wt 179 lb 12.8 oz (81.557 kg)  BMI 30.14 kg/m2  Body mass index is 30.14 kg/(m^2).  General appearance : Well developed well nourished female. No acute distress HEENT: Neck supple, trachea midline, no carotid bruits, no thyroidmegaly Lungs: Clear to auscultation, no rhonchi or wheezes, or rib retractions  Heart: Regular rate and rhythm, no murmurs or gallops Breast:Examined in sitting and supine position were symmetrical in appearance, no palpable masses or tenderness,  no skin retraction, no nipple inversion, no nipple discharge, no skin discoloration, no axillary or supraclavicular lymphadenopathy Abdomen: no palpable masses or tenderness, no rebound or guarding Extremities: no edema or skin discoloration or tenderness,left arm all  Pelvic:  Bartholin, Urethra, Skene Glands: Within normal limits             Vagina: No gross lesions or discharge  Cervix: absent  Uterus Absent  Adnexa  Without masses or tenderness  Anus and perineum  normal   Rectovaginal  normal sphincter tone without palpated masses or tenderness             Hemoccult cards provided  Assessment/Plan:  79 y.o. female with left arm mole will return to the office in a few weeks to remove and submitted for pathological evaluation. Patient received the flu vaccine today. We discussed importance of calcium vitamin D for osteoporosis prevention. She will also schedule her bone density study here in the office as well. Pap smear was not done in accordance to the new guidelines. Blood work by her PCP and endocrinologist. Hemoccult cards provided for the  patient is admitted to the office for testing at a later date.     Note: This dictation was prepared with  Dragon/digital dictation along withSmart phrase technology. Any transcriptional errors that result from this process are unintentional.   Ok Edwards MD, 12:49 PM 04/04/2013

## 2013-04-04 NOTE — Patient Instructions (Signed)
Bone Densitometry Bone densitometry is a special X-ray that measures your bone density and can be used to help predict your risk of bone fractures. This test is used to determine bone mineral content and density to diagnose osteoporosis. Osteoporosis is the loss of bone that may cause the bone to become weak. Osteoporosis commonly occurs in women entering menopause. However, it may be found in men and in people with other diseases. PREPARATION FOR TEST No preparation necessary. WHO SHOULD BE TESTED?  All women older than 65.  Postmenopausal women (50 to 65) with risk factors for osteoporosis.  People with a previous fracture caused by normal activities.  People with a small body frame (less than 127 poundsor a body mass index [BMI] of less than 21).  People who have a parent with a hip fracture or history of osteoporosis.  People who smoke.  People who have rheumatoid arthritis.  Anyone who engages in excessive alcohol use (more than 3 drinks most days).  Women who experience early menopause. WHEN SHOULD YOU BE RETESTED? Current guidelines suggest that you should wait at least 2 years before doing a bone density test again if your first test was normal.Recent studies indicated that women with normal bone density may be able to wait a few years before needing to repeat a bone density test. You should discuss this with your caregiver.  NORMAL FINDINGS   Normal: less than standard deviation below normal (greater than -1).  Osteopenia: 1 to 2.5 standard deviations below normal (-1 to -2.5).  Osteoporosis: greater than 2.5 standard deviations below normal (less than -2.5). Test results are reported as a "T score" and a "Z score."The T score is a number that compares your bone density with the bone density of healthy, young women.The Z score is a number that compares your bone density with the scores of women who are the same age, gender, and race.  Ranges for normal findings may vary  among different laboratories and hospitals. You should always check with your doctor after having lab work or other tests done to discuss the meaning of your test results and whether your values are considered within normal limits. MEANING OF TEST  Your caregiver will go over the test results with you and discuss the importance and meaning of your results, as well as treatment options and the need for additional tests if necessary. OBTAINING THE TEST RESULTS It is your responsibility to obtain your test results. Ask the lab or department performing the test when and how you will get your results. Document Released: 07/06/2004 Document Revised: 09/06/2011 Document Reviewed: 07/29/2010 [employer] Patient Information 2014 [employer].  

## 2013-04-10 ENCOUNTER — Encounter: Payer: Self-pay | Admitting: Gynecology

## 2013-04-23 ENCOUNTER — Other Ambulatory Visit: Payer: Medicare PPO | Admitting: Anesthesiology

## 2013-04-23 DIAGNOSIS — Z1211 Encounter for screening for malignant neoplasm of colon: Secondary | ICD-10-CM

## 2013-05-03 ENCOUNTER — Ambulatory Visit: Payer: Medicare PPO | Admitting: Gynecology

## 2013-11-01 ENCOUNTER — Encounter (INDEPENDENT_AMBULATORY_CARE_PROVIDER_SITE_OTHER): Payer: Self-pay | Admitting: Surgery

## 2013-11-05 ENCOUNTER — Other Ambulatory Visit (HOSPITAL_COMMUNITY): Payer: Self-pay | Admitting: Internal Medicine

## 2013-11-05 DIAGNOSIS — Z1231 Encounter for screening mammogram for malignant neoplasm of breast: Secondary | ICD-10-CM

## 2013-11-20 ENCOUNTER — Ambulatory Visit (INDEPENDENT_AMBULATORY_CARE_PROVIDER_SITE_OTHER): Payer: Medicare PPO | Admitting: Surgery

## 2013-12-12 ENCOUNTER — Ambulatory Visit (HOSPITAL_COMMUNITY)
Admission: RE | Admit: 2013-12-12 | Discharge: 2013-12-12 | Disposition: A | Discharge status: Home / Self Care | Payer: Medicare PPO | Source: Ambulatory Visit | Attending: Internal Medicine | Admitting: Internal Medicine

## 2013-12-12 DIAGNOSIS — Z1231 Encounter for screening mammogram for malignant neoplasm of breast: Secondary | ICD-10-CM | POA: Insufficient documentation

## 2014-04-29 ENCOUNTER — Encounter (INDEPENDENT_AMBULATORY_CARE_PROVIDER_SITE_OTHER): Payer: Self-pay | Admitting: Surgery

## 2014-12-03 ENCOUNTER — Other Ambulatory Visit (HOSPITAL_COMMUNITY): Payer: Self-pay | Admitting: Internal Medicine

## 2014-12-03 DIAGNOSIS — Z1231 Encounter for screening mammogram for malignant neoplasm of breast: Secondary | ICD-10-CM

## 2014-12-13 DIAGNOSIS — G43009 Migraine without aura, not intractable, without status migrainosus: Secondary | ICD-10-CM | POA: Diagnosis not present

## 2014-12-13 DIAGNOSIS — J329 Chronic sinusitis, unspecified: Secondary | ICD-10-CM | POA: Diagnosis not present

## 2014-12-19 ENCOUNTER — Ambulatory Visit (HOSPITAL_COMMUNITY)
Admission: RE | Admit: 2014-12-19 | Discharge: 2014-12-19 | Disposition: A | Discharge status: Home / Self Care | Payer: Medicare PPO | Source: Ambulatory Visit | Attending: Internal Medicine | Admitting: Internal Medicine

## 2014-12-19 DIAGNOSIS — Z1231 Encounter for screening mammogram for malignant neoplasm of breast: Secondary | ICD-10-CM | POA: Insufficient documentation

## 2014-12-24 ENCOUNTER — Other Ambulatory Visit: Payer: Self-pay | Admitting: Otolaryngology

## 2014-12-24 DIAGNOSIS — J329 Chronic sinusitis, unspecified: Secondary | ICD-10-CM

## 2014-12-24 DIAGNOSIS — R0981 Nasal congestion: Secondary | ICD-10-CM

## 2014-12-27 ENCOUNTER — Encounter: Payer: Self-pay | Admitting: Gastroenterology

## 2015-01-07 ENCOUNTER — Ambulatory Visit
Admission: RE | Admit: 2015-01-07 | Discharge: 2015-01-07 | Disposition: A | Discharge status: Home / Self Care | Payer: Medicare PPO | Source: Ambulatory Visit | Attending: Otolaryngology | Admitting: Otolaryngology

## 2015-01-07 DIAGNOSIS — R0981 Nasal congestion: Secondary | ICD-10-CM

## 2015-01-07 DIAGNOSIS — J329 Chronic sinusitis, unspecified: Secondary | ICD-10-CM | POA: Diagnosis not present

## 2015-01-14 DIAGNOSIS — Z049 Encounter for examination and observation for unspecified reason: Secondary | ICD-10-CM | POA: Diagnosis not present

## 2015-01-14 DIAGNOSIS — R51 Headache: Secondary | ICD-10-CM | POA: Diagnosis not present

## 2015-02-05 DIAGNOSIS — M791 Myalgia: Secondary | ICD-10-CM | POA: Diagnosis not present

## 2015-02-05 DIAGNOSIS — R51 Headache: Secondary | ICD-10-CM | POA: Diagnosis not present

## 2015-02-05 DIAGNOSIS — M542 Cervicalgia: Secondary | ICD-10-CM | POA: Diagnosis not present

## 2015-02-28 DIAGNOSIS — M791 Myalgia: Secondary | ICD-10-CM | POA: Diagnosis not present

## 2015-02-28 DIAGNOSIS — R51 Headache: Secondary | ICD-10-CM | POA: Diagnosis not present

## 2015-02-28 DIAGNOSIS — M542 Cervicalgia: Secondary | ICD-10-CM | POA: Diagnosis not present

## 2015-03-12 DIAGNOSIS — Z23 Encounter for immunization: Secondary | ICD-10-CM | POA: Diagnosis not present

## 2015-04-04 DIAGNOSIS — M542 Cervicalgia: Secondary | ICD-10-CM | POA: Diagnosis not present

## 2015-04-04 DIAGNOSIS — R51 Headache: Secondary | ICD-10-CM | POA: Diagnosis not present

## 2015-04-04 DIAGNOSIS — M791 Myalgia: Secondary | ICD-10-CM | POA: Diagnosis not present

## 2015-04-04 DIAGNOSIS — Z79899 Other long term (current) drug therapy: Secondary | ICD-10-CM | POA: Diagnosis not present

## 2015-04-23 DIAGNOSIS — R51 Headache: Secondary | ICD-10-CM | POA: Diagnosis not present

## 2015-04-23 DIAGNOSIS — M542 Cervicalgia: Secondary | ICD-10-CM | POA: Diagnosis not present

## 2015-04-23 DIAGNOSIS — M791 Myalgia: Secondary | ICD-10-CM | POA: Diagnosis not present

## 2015-04-30 DIAGNOSIS — E040 Nontoxic diffuse goiter: Secondary | ICD-10-CM | POA: Diagnosis not present

## 2015-04-30 DIAGNOSIS — E782 Mixed hyperlipidemia: Secondary | ICD-10-CM | POA: Diagnosis not present

## 2015-04-30 DIAGNOSIS — E119 Type 2 diabetes mellitus without complications: Secondary | ICD-10-CM | POA: Diagnosis not present

## 2015-04-30 DIAGNOSIS — E559 Vitamin D deficiency, unspecified: Secondary | ICD-10-CM | POA: Diagnosis not present

## 2015-05-02 DIAGNOSIS — E040 Nontoxic diffuse goiter: Secondary | ICD-10-CM | POA: Diagnosis not present

## 2015-05-02 DIAGNOSIS — E559 Vitamin D deficiency, unspecified: Secondary | ICD-10-CM | POA: Diagnosis not present

## 2015-05-02 DIAGNOSIS — I10 Essential (primary) hypertension: Secondary | ICD-10-CM | POA: Diagnosis not present

## 2015-05-02 DIAGNOSIS — E119 Type 2 diabetes mellitus without complications: Secondary | ICD-10-CM | POA: Diagnosis not present

## 2015-05-02 DIAGNOSIS — E782 Mixed hyperlipidemia: Secondary | ICD-10-CM | POA: Diagnosis not present

## 2015-05-14 DIAGNOSIS — R51 Headache: Secondary | ICD-10-CM | POA: Diagnosis not present

## 2015-05-14 DIAGNOSIS — M791 Myalgia: Secondary | ICD-10-CM | POA: Diagnosis not present

## 2015-05-14 DIAGNOSIS — M542 Cervicalgia: Secondary | ICD-10-CM | POA: Diagnosis not present

## 2015-06-10 DIAGNOSIS — M542 Cervicalgia: Secondary | ICD-10-CM | POA: Diagnosis not present

## 2015-06-10 DIAGNOSIS — M791 Myalgia: Secondary | ICD-10-CM | POA: Diagnosis not present

## 2015-06-10 DIAGNOSIS — R51 Headache: Secondary | ICD-10-CM | POA: Diagnosis not present

## 2015-06-19 ENCOUNTER — Other Ambulatory Visit: Payer: Self-pay | Admitting: Internal Medicine

## 2015-06-19 DIAGNOSIS — M62831 Muscle spasm of calf: Secondary | ICD-10-CM | POA: Diagnosis not present

## 2015-06-19 DIAGNOSIS — N182 Chronic kidney disease, stage 2 (mild): Secondary | ICD-10-CM | POA: Diagnosis not present

## 2015-06-19 DIAGNOSIS — I129 Hypertensive chronic kidney disease with stage 1 through stage 4 chronic kidney disease, or unspecified chronic kidney disease: Secondary | ICD-10-CM | POA: Diagnosis not present

## 2015-06-19 DIAGNOSIS — M79604 Pain in right leg: Secondary | ICD-10-CM

## 2015-06-19 DIAGNOSIS — E1122 Type 2 diabetes mellitus with diabetic chronic kidney disease: Secondary | ICD-10-CM | POA: Diagnosis not present

## 2015-06-19 DIAGNOSIS — N08 Glomerular disorders in diseases classified elsewhere: Secondary | ICD-10-CM | POA: Diagnosis not present

## 2015-06-19 DIAGNOSIS — Z79899 Other long term (current) drug therapy: Secondary | ICD-10-CM | POA: Diagnosis not present

## 2015-06-19 DIAGNOSIS — M7989 Other specified soft tissue disorders: Secondary | ICD-10-CM

## 2015-06-20 ENCOUNTER — Ambulatory Visit
Admission: RE | Admit: 2015-06-20 | Discharge: 2015-06-20 | Disposition: A | Discharge status: Home / Self Care | Payer: Medicare PPO | Source: Ambulatory Visit | Attending: Internal Medicine | Admitting: Internal Medicine

## 2015-06-20 DIAGNOSIS — M79661 Pain in right lower leg: Secondary | ICD-10-CM | POA: Diagnosis not present

## 2015-06-20 DIAGNOSIS — M7989 Other specified soft tissue disorders: Secondary | ICD-10-CM

## 2015-06-20 DIAGNOSIS — M79604 Pain in right leg: Secondary | ICD-10-CM

## 2015-09-26 ENCOUNTER — Encounter: Payer: Self-pay | Admitting: *Deleted

## 2015-09-30 ENCOUNTER — Encounter: Payer: Self-pay | Admitting: Gastroenterology

## 2015-09-30 ENCOUNTER — Ambulatory Visit (INDEPENDENT_AMBULATORY_CARE_PROVIDER_SITE_OTHER): Payer: Medicare Other | Admitting: Gastroenterology

## 2015-09-30 VITALS — BP 134/72 | HR 76 | Ht 65.5 in | Wt 180.0 lb

## 2015-09-30 DIAGNOSIS — K5900 Constipation, unspecified: Secondary | ICD-10-CM

## 2015-09-30 DIAGNOSIS — K602 Anal fissure, unspecified: Secondary | ICD-10-CM

## 2015-09-30 MED ORDER — AMBULATORY NON FORMULARY MEDICATION: Status: DC

## 2015-09-30 NOTE — Progress Notes (Signed)
HPI :  82 y/o female, former patient of Dr. Deatra Ina who has not been seen in 4 years, here for a visit regarding rectal discomfort. She has a history of DM, HTN, and HLD.  Patient has endorsed some rectal discomfort with bowel movements. She reports this had been ongoing for a few weeks. She felt something protrude out of her which was painful. She had used some preparatoin H she had used in the past and this went away. She reports removely having pain this like, but not chronic. She had pain in the anal canal with passing stools. She denied any blood in the stools. She has been straining and passing hard stools. She took some stool softeners OTC which she thinks helped soften the stools. She has about 2 BMs per day. She normally has soft stools but has been constipated recently. No abdominal pains. NO weight loss. No FH of colon cancer. She is otherwise eating okay without complaints. She reports her rectal pain has not bothered her for past 4 days or so, seems to have resolved. She had a prior colonoscopy in 2010.  She had negative FOBT testing in 2014  Colonoscopy 2010 , one hyperplastic polyp removed  Past Medical History  Diagnosis Date  . Diabetes mellitus   . Hyperlipidemia   . Hypertension   . Diverticulosis   . Colon polyps     adenomatous polyps  . Microcytic anemia      Past Surgical History  Procedure Laterality Date  . Abdominal hysterectomy  1970    with RSO   Family History  Problem Relation Age of Onset  . Diabetes Sister     twin  . Diabetes Mother   . Heart disease Paternal Aunt   . Melanoma Father   . Stroke Paternal Grandmother   . Breast cancer Maternal Aunt     x 2 aunts   Social History  Substance Use Topics  . Smoking status: Former Smoker    Types: Cigarettes  . Smokeless tobacco: Never Used  . Alcohol Use: No   Current Outpatient Prescriptions  Medication Sig Dispense Refill  . aspirin EC 81 MG tablet Take 81 mg by mouth daily.    Marland Kitchen  atorvastatin (LIPITOR) 40 MG tablet Take 40 mg by mouth daily.    . Calcium Carbonate-Vitamin D (CALCIUM 600/VITAMIN D PO) Take 1 tablet by mouth daily.    . Coenzyme Q10 (CO Q 10) 100 MG CAPS Take 1 tablet by mouth daily.    Marland Kitchen ezetimibe (ZETIA) 10 MG tablet Take 10 mg by mouth daily.    Marland Kitchen gabapentin (NEURONTIN) 100 MG capsule Take 100 mg by mouth as needed.    . gabapentin (NEURONTIN) 300 MG capsule Take 300 mg by mouth at bedtime.    . hydrochlorothiazide (MICROZIDE) 12.5 MG capsule Take 12.5 mg by mouth daily.    . metFORMIN (GLUCOPHAGE-XR) 750 MG 24 hr tablet Take 750 mg by mouth 2 (two) times daily.     . metoprolol tartrate (LOPRESSOR) 25 MG tablet Take 12.5 mg by mouth daily.    . Multiple Vitamin (MULITIVITAMIN WITH MINERALS) TABS Take 1 tablet by mouth daily.    . pioglitazone (ACTOS) 45 MG tablet Take 45 mg by mouth daily.    . quinapril (ACCUPRIL) 40 MG tablet Take 20 mg by mouth daily.      No current facility-administered medications for this visit.   No Known Allergies   Review of Systems: All systems reviewed and negative except  where noted in HPI.   Lab Results  Component Value Date   WBC 6.6 05/10/2012   HGB 12.7 05/10/2012   HCT 39.2 05/10/2012   MCV 76.7* 05/10/2012   PLT 290.0 05/10/2012    Lab Results  Component Value Date   FERRITIN 19.6 05/10/2012    Lab Results  Component Value Date   IRON 54 05/10/2012   FERRITIN 19.6 05/10/2012     Physical Exam: BP 134/72 mmHg  Pulse 76  Ht 5' 5.5" (1.664 m)  Wt 180 lb (81.647 kg)  BMI 29.49 kg/m2 Constitutional: Pleasant,well-developed, female in no acute distress. HEENT: Normocephalic and atraumatic. Conjunctivae are normal. No scleral icterus. Neck supple.  Cardiovascular: Normal rate, regular rhythm.  Pulmonary/chest: Effort normal and breath sounds normal. No wheezing, rales or rhonchi. Abdominal: Soft, nondistended, nontender. Bowel sounds active throughout. There are no masses palpable. No  hepatomegaly. DRE / Perianal exam - anal fissure in the posterior midline, tender to palpation with reproduction of the patient's symptoms, without other pathology, no mass on DRE. Anoscopy deferred due to pain associated with the fissure. Extremities: no edema Lymphadenopathy: No cervical adenopathy noted. Neurological: Alert and oriented to person place and time. Skin: Skin is warm and dry. No rashes noted. Psychiatric: Normal mood and affect. Behavior is normal.   ASSESSMENT AND PLAN: 82 y/o female with history as outlined above presenting with symptoms of intermittent rectal discomfort in recent weeks in the setting of passing hard stools / straining. DRE shows an anal fissure which reproduced her symptoms and is the most likely cause of her symptoms. Counseled her to use a daily fiber supplement to soften stools and minimize straining. Otherwise will prescribe topical nitroglycerin ointment at 0.125% strength to use TID for up to 6 weeks as needed. If her symptoms do not improve on this regimen she should follow up with me for reassessment to ensure appropriate healing of the fissure and ensure no other pathology. Otherwise, given her age she does not warrant future colonoscopy screening, however if she did strongly wish to continue screening she would not be due until 2020.    [place] Cellar, MD [facility] Pager [phone]

## 2015-09-30 NOTE — Patient Instructions (Addendum)
We have sent a prescription for nitroglycerin 0.125% gel to [facility]. You should apply a pea size amount to your rectum three times daily x 6 weeks.  [facility]'s information is below: Address: [address] [place], [place] [address]  [phone]   Please purchase a fiber supplement over the counter and take as directed.   Please follow up with Dr Havery Moros as needed.  If you are age 80 or older, your body mass index should be between 23-30. Your Body mass index is 29.49 kg/(m^2). If this is out of the aforementioned range listed, please consider follow up with your Primary Care Provider.  If you are age 46 or younger, your body mass index should be between 19-25. Your Body mass index is 29.49 kg/(m^2). If this is out of the aformentioned range listed, please consider follow up with your Primary Care Provider.

## 2015-12-09 ENCOUNTER — Ambulatory Visit (INDEPENDENT_AMBULATORY_CARE_PROVIDER_SITE_OTHER): Payer: Medicare Other | Admitting: Sports Medicine

## 2015-12-09 ENCOUNTER — Encounter: Payer: Self-pay | Admitting: Sports Medicine

## 2015-12-09 DIAGNOSIS — B351 Tinea unguium: Secondary | ICD-10-CM

## 2015-12-09 DIAGNOSIS — M79674 Pain in right toe(s): Secondary | ICD-10-CM

## 2015-12-09 DIAGNOSIS — E1142 Type 2 diabetes mellitus with diabetic polyneuropathy: Secondary | ICD-10-CM

## 2015-12-09 DIAGNOSIS — M79675 Pain in left toe(s): Secondary | ICD-10-CM | POA: Diagnosis not present

## 2015-12-09 NOTE — Progress Notes (Signed)
Patient ID: Maureen Walker, female   DOB: 18-Oct-1932, 82 y.o.   MRN: [id number] Subjective: Maureen Walker is a 82 y.o. female patient with history of diabetes who presents to office today complaining of long, painful nails  while ambulating in shoes; unable to trim. Patient states that the glucose reading this morning was not recorded; A1c 6.5. Patient denies any new changes in medication or new problems. Patient denies any new cramping, numbness, burning or tingling in the legs.  Patient Active Problem List   Diagnosis Date Noted  . Skin mole 04/04/2013  . Hemorrhage of rectum and anus 05/10/2012  . Anemia 05/10/2012   Current Outpatient Prescriptions on File Prior to Visit  Medication Sig Dispense Refill  . AMBULATORY NON FORMULARY MEDICATION Medication Name: Nitroglycerin 0.125 mg three times daily to rectum x 6 weeks. 30 g 0  . aspirin EC 81 MG tablet Take 81 mg by mouth daily.    Marland Kitchen atorvastatin (LIPITOR) 40 MG tablet Take 40 mg by mouth daily.    . Calcium Carbonate-Vitamin D (CALCIUM 600/VITAMIN D PO) Take 1 tablet by mouth daily.    . Coenzyme Q10 (CO Q 10) 100 MG CAPS Take 1 tablet by mouth daily.    Marland Kitchen ezetimibe (ZETIA) 10 MG tablet Take 10 mg by mouth daily.    Marland Kitchen gabapentin (NEURONTIN) 100 MG capsule Take 100 mg by mouth as needed.    . gabapentin (NEURONTIN) 300 MG capsule Take 300 mg by mouth at bedtime.    . hydrochlorothiazide (MICROZIDE) 12.5 MG capsule Take 12.5 mg by mouth daily.    . metFORMIN (GLUCOPHAGE-XR) 750 MG 24 hr tablet Take 750 mg by mouth 2 (two) times daily.     . metoprolol tartrate (LOPRESSOR) 25 MG tablet Take 12.5 mg by mouth daily.    . Multiple Vitamin (MULITIVITAMIN WITH MINERALS) TABS Take 1 tablet by mouth daily.    . pioglitazone (ACTOS) 45 MG tablet Take 45 mg by mouth daily.    . quinapril (ACCUPRIL) 40 MG tablet Take 20 mg by mouth daily.      No current facility-administered medications on file prior to visit.   No Known Allergies  No  results found for this or any previous visit (from the past 2160 hour(s)).  Objective: General: Patient is awake, alert, and oriented x 3 and in no acute distress.  Integument: Skin is warm, dry and supple bilateral. Nails are tender, long, thickened and  dystrophic with subungual debris, consistent with onychomycosis, 1-5 bilateral. Resolved tinea bilateral. No signs of infection. No open lesions or preulcerative lesions present bilateral. Remaining integument unremarkable.  Vasculature:  Dorsalis Pedis pulse 2/4 bilateral. Posterior Tibial pulse  1/4 bilateral.  Capillary fill time <3 sec 1-5 bilateral. Positive hair growth to the level of the digits. Temperature gradient within normal limits. No varicosities present bilateral. No edema present bilateral.   Neurology: The patient has intact sensation measured with a 5.07/10g Semmes Weinstein Monofilament at all pedal sites bilateral . Vibratory sensation diminished bilateral with tuning fork. No Babinski sign present bilateral.   Musculoskeletal: Asymptomatic bunion and hammertoe pedal deformities noted bilateral. Muscular strength 5/5 in all lower extremity muscular groups bilateral without pain on range of motion . No tenderness with calf compression bilateral.  Assessment and Plan: Problem List Items Addressed This Visit    None    Visit Diagnoses    Dermatophytosis of nail    -  Primary    Toe pain, bilateral  Diabetic polyneuropathy associated with type 2 diabetes mellitus ([place])           -Examined patient. -Discussed and educated patient on diabetic foot care, especially with  regards to the vascular, neurological and musculoskeletal systems.  -Stressed the importance of good glycemic control and the detriment of not  controlling glucose levels in relation to the foot. -Mechanically debrided all nails 1-5 bilateral using sterile nail nipper and filed with dremel without incident  -Advised Lamisil cream if recurs   -Answered all patient questions -Patient to return  in 3 months for at risk foot care -Patient advised to call the office if any problems or questions arise in the meantime.  Landis Martins, DPM

## 2015-12-09 NOTE — Patient Instructions (Addendum)
Diabetes and Foot Care Diabetes may cause you to have problems because of poor blood supply (circulation) to your feet and legs. This may cause the skin on your feet to become thinner, break easier, and heal more slowly. Your skin may become dry, and the skin may peel and crack. You may also have nerve damage in your legs and feet causing decreased feeling in them. You may not notice minor injuries to your feet that could lead to infections or more serious problems. Taking care of your feet is one of the most important things you can do for yourself.  HOME CARE INSTRUCTIONS  Wear shoes at all times, even in the house. Do not go barefoot. Bare feet are easily injured.  Check your feet daily for blisters, cuts, and redness. If you cannot see the bottom of your feet, use a mirror or ask someone for help.  Wash your feet with warm water (do not use hot water) and mild soap. Then pat your feet and the areas between your toes until they are completely dry. Do not soak your feet as this can dry your skin.  Apply a moisturizing lotion or petroleum jelly (that does not contain alcohol and is unscented) to the skin on your feet and to dry, brittle toenails. Do not apply lotion between your toes.  Trim your toenails straight across. Do not dig under them or around the cuticle. File the edges of your nails with an emery board or nail file.  Do not cut corns or calluses or try to remove them with medicine.  Wear clean socks or stockings every day. Make sure they are not too tight. Do not wear knee-high stockings since they may decrease blood flow to your legs.  Wear shoes that fit properly and have enough cushioning. To break in new shoes, wear them for just a few hours a day. This prevents you from injuring your feet. Always look in your shoes before you put them on to be sure there are no objects inside.  Do not cross your legs. This may decrease the blood flow to your feet.  If you find a minor scrape,  cut, or break in the skin on your feet, keep it and the skin around it clean and dry. These areas may be cleansed with mild soap and water. Do not cleanse the area with peroxide, alcohol, or iodine.  When you remove an adhesive bandage, be sure not to damage the skin around it.  If you have a wound, look at it several times a day to make sure it is healing.  Do not use heating pads or hot water bottles. They may burn your skin. If you have lost feeling in your feet or legs, you may not know it is happening until it is too late.  Make sure your health care provider performs a complete foot exam at least annually or more often if you have foot problems. Report any cuts, sores, or bruises to your health care provider immediately. SEEK MEDICAL CARE IF:   You have an injury that is not healing.  You have cuts or breaks in the skin.  You have an ingrown nail.  You notice redness on your legs or feet.  You feel burning or tingling in your legs or feet.  You have pain or cramps in your legs and feet.  Your legs or feet are numb.  Your feet always feel cold. SEEK IMMEDIATE MEDICAL CARE IF:   There is increasing redness,   swelling, or pain in or around a wound.  There is a red line that goes up your leg.  Pus is coming from a wound.  You develop a fever or as directed by your health care provider.  You notice a bad smell coming from an ulcer or wound.   This information is not intended to replace advice given to you by your health care provider. Make sure you discuss any questions you have with your health care provider.   Document Released: 06/11/2000 Document Revised: 02/14/2013 Document Reviewed: 11/21/2012 Elsevier Interactive Patient Education 2016 [employer] for skin changes to feet as needed

## 2015-12-09 NOTE — Progress Notes (Deleted)
   Subjective:    Patient ID: Maureen Walker, female    DOB: 11-Sep-1932, 82 y.o.   MRN: [id number]  HPI    Review of Systems  All other systems reviewed and are negative.      Objective:   Physical Exam        Assessment & Plan:

## 2015-12-22 ENCOUNTER — Other Ambulatory Visit: Payer: Self-pay | Admitting: Internal Medicine

## 2015-12-22 DIAGNOSIS — Z1231 Encounter for screening mammogram for malignant neoplasm of breast: Secondary | ICD-10-CM

## 2016-01-08 ENCOUNTER — Ambulatory Visit
Admission: RE | Admit: 2016-01-08 | Discharge: 2016-01-08 | Disposition: A | Discharge status: Home / Self Care | Payer: Medicare Other | Source: Ambulatory Visit | Attending: Internal Medicine | Admitting: Internal Medicine

## 2016-01-08 DIAGNOSIS — Z1231 Encounter for screening mammogram for malignant neoplasm of breast: Secondary | ICD-10-CM

## 2016-01-09 ENCOUNTER — Ambulatory Visit: Payer: Medicare Other

## 2016-03-08 ENCOUNTER — Ambulatory Visit: Payer: Medicare Other | Admitting: Sports Medicine

## 2016-03-09 ENCOUNTER — Encounter: Payer: Self-pay | Admitting: Sports Medicine

## 2016-03-09 ENCOUNTER — Ambulatory Visit (INDEPENDENT_AMBULATORY_CARE_PROVIDER_SITE_OTHER): Payer: Medicare Other | Admitting: Sports Medicine

## 2016-03-09 DIAGNOSIS — M79674 Pain in right toe(s): Secondary | ICD-10-CM | POA: Diagnosis not present

## 2016-03-09 DIAGNOSIS — M79675 Pain in left toe(s): Secondary | ICD-10-CM

## 2016-03-09 DIAGNOSIS — M21619 Bunion of unspecified foot: Secondary | ICD-10-CM

## 2016-03-09 DIAGNOSIS — E1142 Type 2 diabetes mellitus with diabetic polyneuropathy: Secondary | ICD-10-CM

## 2016-03-09 DIAGNOSIS — B351 Tinea unguium: Secondary | ICD-10-CM

## 2016-03-09 NOTE — Progress Notes (Signed)
Patient ID: Maureen Walker, female   DOB: 10-05-1932, 82 y.o.   MRN: [id number] Subjective: Maureen Walker is a 82 y.o. female patient with history of diabetes who returns to office today complaining of long, painful nails  while ambulating in shoes; unable to trim. Patient states that the glucose reading this morning was not recorded; A1c 6.0, down from previous. Patient denies any new changes in medication or new problems. Patient denies any new cramping, numbness, burning or tingling in the legs.  Patient Active Problem List   Diagnosis Date Noted  . Skin mole 04/04/2013  . Hemorrhage of rectum and anus 05/10/2012  . Anemia 05/10/2012   Current Outpatient Prescriptions on File Prior to Visit  Medication Sig Dispense Refill  . AMBULATORY NON FORMULARY MEDICATION Medication Name: Nitroglycerin 0.125 mg three times daily to rectum x 6 weeks. 30 g 0  . aspirin EC 81 MG tablet Take 81 mg by mouth daily.    Marland Kitchen atorvastatin (LIPITOR) 40 MG tablet Take 40 mg by mouth daily.    . Calcium Carbonate-Vitamin D (CALCIUM 600/VITAMIN D PO) Take 1 tablet by mouth daily.    . Coenzyme Q10 (CO Q 10) 100 MG CAPS Take 1 tablet by mouth daily.    Marland Kitchen ezetimibe (ZETIA) 10 MG tablet Take 10 mg by mouth daily.    Marland Kitchen gabapentin (NEURONTIN) 100 MG capsule Take 100 mg by mouth as needed.    . gabapentin (NEURONTIN) 300 MG capsule Take 300 mg by mouth at bedtime.    . hydrochlorothiazide (MICROZIDE) 12.5 MG capsule Take 12.5 mg by mouth daily.    . metFORMIN (GLUCOPHAGE-XR) 750 MG 24 hr tablet Take 750 mg by mouth 2 (two) times daily.     . metoprolol tartrate (LOPRESSOR) 25 MG tablet Take 12.5 mg by mouth daily.    . Multiple Vitamin (MULITIVITAMIN WITH MINERALS) TABS Take 1 tablet by mouth daily.    . pioglitazone (ACTOS) 45 MG tablet Take 45 mg by mouth daily.    . quinapril (ACCUPRIL) 40 MG tablet Take 20 mg by mouth daily.      No current facility-administered medications on file prior to visit.    No Known  Allergies  No results found for this or any previous visit (from the past 2160 hour(s)).  Objective: General: Patient is awake, alert, and oriented x 3 and in no acute distress.  Integument: Skin is warm, dry and supple bilateral. Nails are tender, mildly elongated, thickened and  dystrophic with subungual debris, consistent with onychomycosis, 1-5 bilateral. No signs of infection. No open lesions or preulcerative lesions present bilateral. Remaining integument unremarkable.  Vasculature:  Dorsalis Pedis pulse 2/4 bilateral. Posterior Tibial pulse  1/4 bilateral.  Capillary fill time <3 sec 1-5 bilateral. Positive hair growth to the level of the digits. Temperature gradient within normal limits. No varicosities present bilateral. No edema present bilateral.   Neurology: The patient has intact sensation measured with a 5.07/10g Semmes Weinstein Monofilament at all pedal sites bilateral . Vibratory sensation diminished bilateral with tuning fork. No Babinski sign present bilateral.   Musculoskeletal: Asymptomatic bunion and hammertoe pedal deformities noted bilateral. Muscular strength 5/5 in all lower extremity muscular groups bilateral without pain on range of motion . No tenderness with calf compression bilateral.  Assessment and Plan: Problem List Items Addressed This Visit    None    Visit Diagnoses    Dermatophytosis of nail    -  Primary   Toe pain, bilateral  Diabetic polyneuropathy associated with type 2 diabetes mellitus ([place])       Bunion          -Examined patient. -Discussed and educated patient on diabetic foot care, especially with  regards to the vascular, neurological and musculoskeletal systems.  -Stressed the importance of good glycemic control and the detriment of not  controlling glucose levels in relation to the foot. -Mechanically debrided all nails 1-5 bilateral using sterile nail nipper and filed with dremel without incident  -Dispensed toe spacer 1st  interspace for bunion and wider good supportive shoes -Answered all patient questions -Patient to return in 3 months for at risk foot care -Patient advised to call the office if any problems or questions arise in the meantime.  Landis Martins, DPM

## 2016-06-08 ENCOUNTER — Ambulatory Visit: Payer: Medicare Other | Admitting: Sports Medicine

## 2016-07-29 DIAGNOSIS — E782 Mixed hyperlipidemia: Secondary | ICD-10-CM | POA: Insufficient documentation

## 2016-07-29 DIAGNOSIS — E559 Vitamin D deficiency, unspecified: Secondary | ICD-10-CM | POA: Insufficient documentation

## 2016-07-29 DIAGNOSIS — E040 Nontoxic diffuse goiter: Secondary | ICD-10-CM | POA: Insufficient documentation

## 2016-07-29 DIAGNOSIS — I10 Essential (primary) hypertension: Secondary | ICD-10-CM | POA: Insufficient documentation

## 2016-08-23 ENCOUNTER — Other Ambulatory Visit: Payer: Self-pay

## 2016-08-23 DIAGNOSIS — M79604 Pain in right leg: Secondary | ICD-10-CM

## 2016-09-03 ENCOUNTER — Encounter: Payer: Self-pay | Admitting: Surgery

## 2016-09-15 ENCOUNTER — Ambulatory Visit (HOSPITAL_COMMUNITY)
Admission: RE | Admit: 2016-09-15 | Discharge: 2016-09-15 | Disposition: A | Discharge status: Home / Self Care | Payer: Medicare Other | Source: Ambulatory Visit | Attending: Surgery | Admitting: Surgery

## 2016-09-15 ENCOUNTER — Encounter: Payer: Self-pay | Admitting: Surgery

## 2016-09-15 ENCOUNTER — Ambulatory Visit (INDEPENDENT_AMBULATORY_CARE_PROVIDER_SITE_OTHER): Payer: Medicare Other | Admitting: Surgery

## 2016-09-15 VITALS — BP 121/60 | HR 64 | Temp 97.9°F | Resp 16 | Ht 65.5 in | Wt 180.0 lb

## 2016-09-15 DIAGNOSIS — M79604 Pain in right leg: Secondary | ICD-10-CM

## 2016-09-15 NOTE — Progress Notes (Signed)
[facility]  Patient name: Maureen Walker MRN: [id number] DOB: [birth year] Sex: female   REFERRING PROVIDER:    Dr. Baird Cancer   REASON FOR CONSULT:    Leg pain  HISTORY OF PRESENT ILLNESS:   Maureen Walker is a 83 y.o. female, who is Referred today for evaluation of right leg pain.  The patient states that she has pain in her right calf and ankle and sometimes in her hip.  She states that her pain goes away or doesn't bother her when she is active and exercising.  It is worse when she lays down.  She denies any open wounds.  She does not endorse claudication like symptoms.  The patient suffers from diabetes.  Her hemoglobin A1c is in the 6 range.  She is on a statin for hypercholesterolemia.  She is medically managed for hypertension.  She is a former smoker.    PAST MEDICAL HISTORY    Past Medical History:  Diagnosis Date  . Colon polyps    adenomatous polyps  . Diabetes mellitus   . Diverticulosis   . Hyperlipidemia   . Hypertension   . Microcytic anemia      FAMILY HISTORY   Family History  Problem Relation Age of Onset  . Diabetes Sister     twin  . Diabetes Mother   . Heart disease Paternal Aunt   . Melanoma Father   . Stroke Paternal Grandmother   . Breast cancer Maternal Aunt     x 2 aunts    SOCIAL HISTORY:   Social History   Social History  . Marital status: Widowed    Spouse name: N/A  . Number of children: 0  . Years of education: N/A   Occupational History  . retired Retired   Social History Main Topics  . Smoking status: Former Smoker    Types: Cigarettes  . Smokeless tobacco: Never Used  . Alcohol use No  . Drug use: No  . Sexual activity: Not on file   Other Topics Concern  . Not on file   Social History Narrative  . No narrative on file    ALLERGIES:    No Known Allergies  CURRENT MEDICATIONS:    Current Outpatient Prescriptions  Medication Sig  Dispense Refill  . AMBULATORY NON FORMULARY MEDICATION Medication Name: Nitroglycerin 0.125 mg three times daily to rectum x 6 weeks. 30 g 0  . aspirin EC 81 MG tablet Take 81 mg by mouth daily.    Marland Kitchen atorvastatin (LIPITOR) 40 MG tablet Take 40 mg by mouth daily.    . Calcium Carbonate-Vitamin D (CALCIUM 600/VITAMIN D PO) Take 1 tablet by mouth daily.    . Coenzyme Q10 (CO Q 10) 100 MG CAPS Take 1 tablet by mouth daily.    Marland Kitchen ezetimibe (ZETIA) 10 MG tablet Take 10 mg by mouth daily.    Marland Kitchen gabapentin (NEURONTIN) 100 MG capsule Take 100 mg by mouth as needed.    . gabapentin (NEURONTIN) 300 MG capsule Take 300 mg by mouth at bedtime.    . hydrochlorothiazide (MICROZIDE) 12.5 MG capsule Take 12.5 mg by mouth daily.    . metFORMIN (GLUCOPHAGE-XR) 750 MG 24 hr tablet Take 750 mg by mouth 2 (two) times daily.     . metoprolol tartrate (LOPRESSOR) 25 MG tablet Take 12.5 mg by mouth daily.    . Multiple Vitamin (MULITIVITAMIN WITH MINERALS) TABS Take 1 tablet by mouth daily.    . pioglitazone (  ACTOS) 45 MG tablet Take 45 mg by mouth daily.    . quinapril (ACCUPRIL) 40 MG tablet Take 20 mg by mouth daily.      No current facility-administered medications for this visit.     REVIEW OF SYSTEMS:   [X]  denotes positive finding, [ ]  denotes negative finding Cardiac  Comments:  Chest pain or chest pressure:    Shortness of breath upon exertion:    Short of breath when lying flat:    Irregular heart rhythm:        Vascular    Pain in calf, thigh, or hip brought on by ambulation: x   Pain in feet at night that wakes you up from your sleep:     Blood clot in your veins:    Leg swelling:  x       Pulmonary    Oxygen at home:    Productive cough:     Wheezing:         Neurologic    Sudden weakness in arms or legs:     Sudden numbness in arms or legs:     Sudden onset of difficulty speaking or slurred speech:    Temporary loss of vision in one eye:     Problems with dizziness:           Gastrointestinal    Blood in stool:      Vomited blood:         Genitourinary    Burning when urinating:     Blood in urine:        Psychiatric    Major depression:         Hematologic    Bleeding problems:    Problems with blood clotting too easily:        Skin    Rashes or ulcers:        Constitutional    Fever or chills:     PHYSICAL EXAM:   There were no vitals filed for this visit.  GENERAL: The patient is a well-nourished female, in no acute distress. The vital signs are documented above. CARDIAC: There is a regular rate and rhythm.  VASCULAR: No carotid bruits.  Palpable dorsalis pedis pulse bilaterally PULMONARY: Nonlabored respirations ABDOMEN: Soft and non-tender.  Aorta could not be palpated MUSCULOSKELETAL: There are no major deformities or cyanosis. NEUROLOGIC: No focal weakness or paresthesias are detected. SKIN: There are no ulcers or rashes noted. PSYCHIATRIC: The patient has a normal affect.  STUDIES:   Vascular lab studies were performed today.  Her ABI was normal bilaterally with triphasic waveforms  ASSESSMENT and PLAN   Right leg pain: I discussed with the patient that with palpable pulses and a normal ankle-brachial index, her symptoms are not related to arterial insufficiency.  I discussed that since her symptoms usually occur with resting, I suspect this is a musculoskeletal problem.  I reassured her that she does not have vascular disease in her lower extremities.  She will follow-up with me on an as-needed basis.   Annamarie Major, MD [facility] of [facility] [phone] Pager [phone]

## 2016-12-06 ENCOUNTER — Other Ambulatory Visit: Payer: Self-pay | Admitting: Internal Medicine

## 2016-12-06 DIAGNOSIS — Z1231 Encounter for screening mammogram for malignant neoplasm of breast: Secondary | ICD-10-CM

## 2017-01-10 ENCOUNTER — Ambulatory Visit
Admission: RE | Admit: 2017-01-10 | Discharge: 2017-01-10 | Disposition: A | Discharge status: Home / Self Care | Payer: Medicare Other | Source: Ambulatory Visit | Attending: Internal Medicine | Admitting: Internal Medicine

## 2017-01-10 DIAGNOSIS — Z1231 Encounter for screening mammogram for malignant neoplasm of breast: Secondary | ICD-10-CM

## 2017-07-25 LAB — HM DIABETES EYE EXAM

## 2017-11-24 ENCOUNTER — Ambulatory Visit: Payer: Medicare Other | Admitting: Podiatry

## 2017-12-01 ENCOUNTER — Ambulatory Visit: Payer: Medicare Other | Admitting: Podiatry

## 2017-12-01 DIAGNOSIS — M79674 Pain in right toe(s): Secondary | ICD-10-CM

## 2017-12-01 DIAGNOSIS — M79675 Pain in left toe(s): Secondary | ICD-10-CM

## 2017-12-01 DIAGNOSIS — B351 Tinea unguium: Secondary | ICD-10-CM

## 2017-12-01 DIAGNOSIS — R21 Rash and other nonspecific skin eruption: Secondary | ICD-10-CM

## 2017-12-04 NOTE — Progress Notes (Signed)
Subjective: 84 year old female presents the office today for concerns of a rash to the arch of her left foot.  She states that her primary care physician did look out and she prescribed a cream for her and the area healed up so she discontinued using the cream.  She states that she is having no itching to her feet she denies any open sores or any rashes.  She is also asking for nails be trimmed today as are thick and elongated.  Denies any redness or drainage from the toenail sites.  She has no other concerns. Denies any systemic complaints such as fevers, chills, nausea, vomiting. No acute changes since last appointment, and no other complaints at this time.   Objective: AAO x3, NAD DP/PT pulses palpable bilaterally, CRT less than 3 seconds Nails appear to be hypertrophic, dystrophic, discolored with ill-defined discoloration of the nails 1-5 bilaterally there is tenderness palpation of the no cellulitis or drainage or any clinical signs of infection noted today.  At this time there is no skin lesions identified except there is a small amount of dry skin present in the bottom of her feet.  There is no open sores there is no pustules present.  No open lesions or pre-ulcerative lesions.  No pain with calf compression, swelling, warmth, erythema  Assessment: Symptomatic onychomycosis, with resolved skin rash  Plan: -All treatment options discussed with the patient including all alternatives, risks, complications.  -Nails debrided x10 without any complications or bleeding. -It appears that the rash that she was having previously has resolved.  I do recommend moisturizer to her feet daily monitor for any recurrence of the rash.  Because the rash is resolved I am not can prescribe any further medications for her. -Discussed the importance of daily foot inspection. -Patient encouraged to call the office with any questions, concerns, change in symptoms.   Trula Slade DPM

## 2017-12-21 ENCOUNTER — Other Ambulatory Visit: Payer: Self-pay | Admitting: Internal Medicine

## 2017-12-21 DIAGNOSIS — Z1231 Encounter for screening mammogram for malignant neoplasm of breast: Secondary | ICD-10-CM

## 2018-01-03 ENCOUNTER — Other Ambulatory Visit: Payer: Self-pay | Admitting: Internal Medicine

## 2018-01-03 DIAGNOSIS — R609 Edema, unspecified: Secondary | ICD-10-CM

## 2018-01-09 ENCOUNTER — Ambulatory Visit
Admission: RE | Admit: 2018-01-09 | Discharge: 2018-01-09 | Disposition: A | Discharge status: Home / Self Care | Payer: Medicare Other | Source: Ambulatory Visit | Attending: Internal Medicine | Admitting: Internal Medicine

## 2018-01-09 DIAGNOSIS — R609 Edema, unspecified: Secondary | ICD-10-CM

## 2018-01-12 ENCOUNTER — Ambulatory Visit
Admission: RE | Admit: 2018-01-12 | Discharge: 2018-01-12 | Disposition: A | Discharge status: Home / Self Care | Payer: Medicare Other | Source: Ambulatory Visit | Attending: Internal Medicine | Admitting: Internal Medicine

## 2018-01-12 DIAGNOSIS — Z1231 Encounter for screening mammogram for malignant neoplasm of breast: Secondary | ICD-10-CM

## 2018-03-09 ENCOUNTER — Encounter: Payer: Self-pay | Admitting: Podiatry

## 2018-03-09 ENCOUNTER — Ambulatory Visit: Payer: Medicare Other | Admitting: Podiatry

## 2018-03-09 DIAGNOSIS — E1142 Type 2 diabetes mellitus with diabetic polyneuropathy: Secondary | ICD-10-CM

## 2018-03-09 DIAGNOSIS — N182 Chronic kidney disease, stage 2 (mild): Secondary | ICD-10-CM

## 2018-03-09 DIAGNOSIS — E1121 Type 2 diabetes mellitus with diabetic nephropathy: Secondary | ICD-10-CM

## 2018-03-09 DIAGNOSIS — M79674 Pain in right toe(s): Secondary | ICD-10-CM | POA: Diagnosis not present

## 2018-03-09 DIAGNOSIS — M79675 Pain in left toe(s): Secondary | ICD-10-CM

## 2018-03-09 DIAGNOSIS — B351 Tinea unguium: Secondary | ICD-10-CM | POA: Diagnosis not present

## 2018-03-09 DIAGNOSIS — Z23 Encounter for immunization: Secondary | ICD-10-CM

## 2018-03-09 DIAGNOSIS — E782 Mixed hyperlipidemia: Secondary | ICD-10-CM

## 2018-03-09 DIAGNOSIS — I129 Hypertensive chronic kidney disease with stage 1 through stage 4 chronic kidney disease, or unspecified chronic kidney disease: Secondary | ICD-10-CM

## 2018-03-09 NOTE — Progress Notes (Signed)
Subjective: 84 y.o. returns the office today for painful, elongated, thickened toenails which she cannot trim herself. Denies any redness or drainage around the nails. She is getting some dry skin to her feet that she tries to moisturize but does not do this regularly. Denies any acute changes since last appointment and no new complaints today. Denies any systemic complaints such as fevers, chills, nausea, vomiting.   PCP: Glendale Chard, MD   Objective: AAO 3, NAD DP/PT pulses palpable, CRT less than 3 seconds Nails hypertrophic, dystrophic, elongated, brittle, discolored 10. There is tenderness overlying the nails 1-5 bilaterally. There is no surrounding erythema or drainage along the nail sites. Dry skin present although mild. No open lesions or itching. No open lesions or pre-ulcerative lesions are identified. No other areas of tenderness bilateral lower extremities. No overlying edema, erythema, increased warmth. No pain with calf compression, swelling, warmth, erythema.  Assessment: Patient presents with symptomatic onychomycosis  Plan: -Treatment options including alternatives, risks, complications were discussed -Nails sharply debrided 10  without complication/bleeding. -Moisturizer to the feet daily but not interdigitally.  -Discussed daily foot inspection. If there are any changes, to call the office immediately.  -Follow-up in 3 months or sooner if any problems are to arise. In the meantime, encouraged to call the office with any questions, concerns, changes symptoms.  Celesta Gentile, DPM

## 2018-03-29 ENCOUNTER — Other Ambulatory Visit: Payer: Medicare Other

## 2018-03-29 DIAGNOSIS — N289 Disorder of kidney and ureter, unspecified: Secondary | ICD-10-CM

## 2018-03-30 LAB — COMPREHENSIVE METABOLIC PANEL
A/G RATIO: 1.9 (ref 1.2–2.2)
ALT: 12 IU/L (ref 0–32)
AST: 18 IU/L (ref 0–40)
Albumin: 4.5 g/dL (ref 3.5–4.7)
Alkaline Phosphatase: 44 IU/L (ref 39–117)
BILIRUBIN TOTAL: 0.4 mg/dL (ref 0.0–1.2)
BUN/Creatinine Ratio: 21 (ref 12–28)
BUN: 17 mg/dL (ref 8–27)
CHLORIDE: 97 mmol/L (ref 96–106)
CO2: 29 mmol/L (ref 20–29)
Calcium: 9.8 mg/dL (ref 8.7–10.3)
Creatinine, Ser: 0.8 mg/dL (ref 0.57–1.00)
GFR calc non Af Amer: 68 mL/min/{1.73_m2} (ref >59)
GFR, EST AFRICAN AMERICAN: 79 mL/min/{1.73_m2} (ref >59)
Globulin, Total: 2.4 g/dL (ref 1.5–4.5)
Glucose: 86 mg/dL (ref 65–99)
POTASSIUM: 4.3 mmol/L (ref 3.5–5.2)
Sodium: 139 mmol/L (ref 134–144)
Total Protein: 6.9 g/dL (ref 6.0–8.5)

## 2018-05-04 ENCOUNTER — Encounter: Payer: Self-pay | Admitting: Internal Medicine

## 2018-05-04 ENCOUNTER — Ambulatory Visit: Payer: Medicare Other | Admitting: Internal Medicine

## 2018-05-04 VITALS — BP 140/86 | HR 50 | Temp 97.8°F | Ht 65.5 in | Wt 174.2 lb

## 2018-05-04 DIAGNOSIS — N182 Chronic kidney disease, stage 2 (mild): Secondary | ICD-10-CM | POA: Diagnosis not present

## 2018-05-04 DIAGNOSIS — G4762 Sleep related leg cramps: Secondary | ICD-10-CM

## 2018-05-04 DIAGNOSIS — I129 Hypertensive chronic kidney disease with stage 1 through stage 4 chronic kidney disease, or unspecified chronic kidney disease: Secondary | ICD-10-CM

## 2018-05-04 NOTE — Patient Instructions (Signed)
Leg Cramps Leg cramps occur when a muscle or muscles tighten and you have no control over this tightening (involuntary muscle contraction). Muscle cramps can develop in any muscle, but the most common place is in the calf muscles of the leg. Those cramps can occur during exercise or when you are at rest. Leg cramps are painful, and they may last for a few seconds to a few minutes. Cramps may return several times before they finally stop. Usually, leg cramps are not caused by a serious medical problem. In many cases, the cause is not known. Some common causes include:  Overexertion.  Overuse from repetitive motions, or doing the same thing over and over.  Remaining in a certain position for a long period of time.  Improper preparation, form, or technique while performing a sport or an activity.  Dehydration.  Injury.  Side effects of some medicines.  Abnormally low levels of the salts and ions in your blood (electrolytes), especially potassium and calcium. These levels could be low if you are taking water pills (diuretics) or if you are pregnant.  Follow these instructions at home: Watch your condition for any changes. Taking the following actions may help to lessen any discomfort that you are feeling:  Stay well-hydrated. Drink enough fluid to keep your urine clear or pale yellow.  Try massaging, stretching, and relaxing the affected muscle. Do this for several minutes at a time.  For tight or tense muscles, use a warm towel, heating pad, or hot shower water directed to the affected area.  If you are sore or have pain after a cramp, applying ice to the affected area may relieve discomfort. ? Put ice in a plastic bag. ? Place a towel between your skin and the bag. ? Leave the ice on for 20 minutes, 2-3 times per day.  Avoid strenuous exercise for several days if you have been having frequent leg cramps.  Make sure that your diet includes the essential minerals for your muscles to  work normally.  Take medicines only as directed by your health care provider.  Contact a health care provider if:  Your leg cramps get more severe or more frequent, or they do not improve over time.  Your foot becomes cold, numb, or blue. This information is not intended to replace advice given to you by your health care provider. Make sure you discuss any questions you have with your health care provider. Document Released: 07/22/2004 Document Revised: 11/20/2015 Document Reviewed: 05/22/2014 Elsevier Interactive Patient Education  2018 [employer]  

## 2018-05-07 ENCOUNTER — Encounter: Payer: Self-pay | Admitting: Internal Medicine

## 2018-05-07 NOTE — Progress Notes (Signed)
Subjective:     Patient ID: Maureen Walker , female    DOB: Jul 13, 1932 , 84 y.o.   MRN: [id number]   Chief Complaint  Patient presents with  . Hypertension    HPI  Hypertension  This is a chronic problem. The current episode started more than 1 year ago. The problem has been gradually improving since onset. The problem is controlled. Pertinent negatives include no blurred vision, chest pain, headaches, palpitations or shortness of breath. There are no associated agents to hypertension. Risk factors for coronary artery disease include diabetes mellitus, dyslipidemia and post-menopausal state.   She reports compliance with meds.   Past Medical History:  Diagnosis Date  . Colon polyps    adenomatous polyps  . Diabetes mellitus   . Diverticulosis   . Hyperlipidemia   . Hypertension   . Microcytic anemia      Family History  Problem Relation Age of Onset  . Diabetes Sister        twin  . Diabetes Mother   . Heart disease Paternal Aunt   . Melanoma Father   . Stroke Paternal Grandmother   . Breast cancer Maternal Aunt        x 2 aunts     Current Outpatient Medications:  .  aspirin EC 81 MG tablet, Take 81 mg by mouth daily., Disp: , Rfl:  .  atorvastatin (LIPITOR) 40 MG tablet, Take 40 mg by mouth daily., Disp: , Rfl:  .  Calcium Carbonate-Vitamin D (CALCIUM 600/VITAMIN D PO), Take 1 tablet by mouth daily., Disp: , Rfl:  .  Coenzyme Q10 (CO Q 10) 100 MG CAPS, Take 1 tablet by mouth daily., Disp: , Rfl:  .  ezetimibe (ZETIA) 10 MG tablet, Take 10 mg by mouth daily., Disp: , Rfl:  .  hydrochlorothiazide (MICROZIDE) 12.5 MG capsule, Take 12.5 mg by mouth daily., Disp: , Rfl:  .  MAGNESIUM PO, Take by mouth., Disp: , Rfl:  .  metFORMIN (GLUCOPHAGE-XR) 750 MG 24 hr tablet, Take 750 mg by mouth 2 (two) times daily. , Disp: , Rfl:  .  metoprolol tartrate (LOPRESSOR) 25 MG tablet, Take 12.5 mg by mouth daily., Disp: , Rfl:  .  Multiple Vitamin (MULITIVITAMIN WITH MINERALS) TABS,  Take 1 tablet by mouth daily., Disp: , Rfl:  .  pioglitazone (ACTOS) 45 MG tablet, Take 45 mg by mouth daily., Disp: , Rfl:  .  quinapril (ACCUPRIL) 40 MG tablet, Take 20 mg by mouth daily. , Disp: , Rfl:  .  VITAMIN D PO, Take by mouth., Disp: , Rfl:    Allergies  Allergen Reactions  . Cholestyramine Other (See Comments)    constipation     Review of Systems  Constitutional: Negative.   Eyes: Negative for blurred vision.  Respiratory: Negative.  Negative for shortness of breath.   Cardiovascular: Negative.  Negative for chest pain and palpitations.  Gastrointestinal: Negative.   Genitourinary: Negative.   Musculoskeletal:       She c/o b/l leg cramps. Usually occur at night. She is unable to think of any other triggers.She denies LE weakness/paresthesias.   Neurological: Negative.  Negative for headaches.  Psychiatric/Behavioral: Negative.      Today's Vitals   05/04/18 1039  BP: 140/86  Pulse: (!) 50  Temp: 97.8 F (36.6 C)  TempSrc: Oral  Weight: 174 lb 3.2 oz (79 kg)  Height: 5' 5.5" (1.664 m)  PainSc: 0-No pain   Body mass index is 28.55 kg/m.  Objective:  Physical Exam  Constitutional: She is oriented to person, place, and time. She appears well-developed and well-nourished.  HENT:  Head: Normocephalic and atraumatic.  Eyes: EOM are normal.  Cardiovascular: Normal rate, regular rhythm and normal heart sounds.  Pulmonary/Chest: Effort normal and breath sounds normal.  Musculoskeletal: Normal range of motion.  Neurological: She is alert and oriented to person, place, and time.  Psychiatric: She has a normal mood and affect.  Nursing note and vitals reviewed.       Assessment And Plan:     1. Hypertensive nephropathy  Fair control.  She will continue with current meds for now. She is encouraged to continue with regular exercise and to avoid salting her foods. She will rto in 3 months for re-evaluation.   2. Chronic renal disease, stage II  Chronic, yet  stable.   3. Leg cramps, sleep related  She is encouraged to start magnesium supplementation nightly. She is advised that this could also help with bp issues. She is also encouraged to increase her water intake.         Maximino Greenland, MD

## 2018-05-08 ENCOUNTER — Other Ambulatory Visit: Payer: Self-pay | Admitting: Internal Medicine

## 2018-05-16 ENCOUNTER — Encounter: Payer: Self-pay | Admitting: Internal Medicine

## 2018-05-16 ENCOUNTER — Ambulatory Visit: Payer: Medicare Other | Admitting: Internal Medicine

## 2018-05-16 VITALS — BP 140/72 | HR 54 | Temp 97.5°F | Ht 65.5 in | Wt 172.2 lb

## 2018-05-16 DIAGNOSIS — I129 Hypertensive chronic kidney disease with stage 1 through stage 4 chronic kidney disease, or unspecified chronic kidney disease: Secondary | ICD-10-CM | POA: Diagnosis not present

## 2018-05-16 DIAGNOSIS — Z7982 Long term (current) use of aspirin: Secondary | ICD-10-CM

## 2018-05-16 DIAGNOSIS — N182 Chronic kidney disease, stage 2 (mild): Secondary | ICD-10-CM | POA: Diagnosis not present

## 2018-05-16 DIAGNOSIS — J0110 Acute frontal sinusitis, unspecified: Secondary | ICD-10-CM

## 2018-05-16 DIAGNOSIS — E1122 Type 2 diabetes mellitus with diabetic chronic kidney disease: Secondary | ICD-10-CM | POA: Insufficient documentation

## 2018-05-16 MED ORDER — AMOXICILLIN 500 MG PO CAPS: 500.0000 mg | ORAL_CAPSULE | Freq: Three times a day (TID) | ORAL | 0 refills | Status: DC

## 2018-05-16 NOTE — Patient Instructions (Signed)

## 2018-05-16 NOTE — Progress Notes (Signed)
Subjective:     Patient ID: Maureen Walker , female    DOB: 1932-12-01 , 84 y.o.   MRN: [id number]   Chief Complaint  Patient presents with  . URI    sinus pressure that radiates down to her left ear. patient states she has tried things over the counter and they are not working. Congestion    HPI  Sinus Problem  This is a new problem. The current episode started 1 to 4 weeks ago. The problem has been gradually worsening since onset. Her pain is at a severity of 7/10. The pain is moderate. Associated symptoms include congestion, coughing, ear pain, sinus pressure and a sore throat. Past treatments include acetaminophen and oral decongestants. The treatment provided no relief.     Past Medical History:  Diagnosis Date  . Colon polyps    adenomatous polyps  . Diabetes mellitus   . Diverticulosis   . Hyperlipidemia   . Hypertension   . Microcytic anemia      Family History  Problem Relation Age of Onset  . Diabetes Sister        twin  . Diabetes Mother   . Heart disease Paternal Aunt   . Melanoma Father   . Stroke Paternal Grandmother   . Breast cancer Maternal Aunt        x 2 aunts     Current Outpatient Medications:  .  aspirin EC 81 MG tablet, Take 81 mg by mouth daily., Disp: , Rfl:  .  atorvastatin (LIPITOR) 40 MG tablet, Take 40 mg by mouth daily., Disp: , Rfl:  .  Calcium Carbonate-Vitamin D (CALCIUM 600/VITAMIN D PO), Take 1 tablet by mouth daily., Disp: , Rfl:  .  Coenzyme Q10 (CO Q 10) 100 MG CAPS, Take 1 tablet by mouth daily., Disp: , Rfl:  .  ezetimibe (ZETIA) 10 MG tablet, Take 10 mg by mouth daily., Disp: , Rfl:  .  hydrochlorothiazide (MICROZIDE) 12.5 MG capsule, Take 12.5 mg by mouth daily., Disp: , Rfl:  .  MAGNESIUM PO, Take by mouth., Disp: , Rfl:  .  metFORMIN (GLUCOPHAGE-XR) 750 MG 24 hr tablet, Take 750 mg by mouth 2 (two) times daily. , Disp: , Rfl:  .  metoprolol tartrate (LOPRESSOR) 25 MG tablet, TAKE ONE-HALF TABLET BY  MOUTH TWICE A DAY,  Disp: 90 tablet, Rfl: 1 .  Multiple Vitamin (MULITIVITAMIN WITH MINERALS) TABS, Take 1 tablet by mouth daily., Disp: , Rfl:  .  pioglitazone (ACTOS) 45 MG tablet, Take 45 mg by mouth daily., Disp: , Rfl:  .  quinapril (ACCUPRIL) 40 MG tablet, Take 20 mg by mouth daily. , Disp: , Rfl:  .  VITAMIN D PO, Take by mouth., Disp: , Rfl:  .  amoxicillin (AMOXIL) 500 MG capsule, Take 1 capsule (500 mg total) by mouth 3 (three) times daily., Disp: 21 capsule, Rfl: 0   Allergies  Allergen Reactions  . Cholestyramine Other (See Comments)    constipation     Review of Systems  Constitutional: Positive for fatigue.  HENT: Positive for congestion, ear pain, sinus pressure and sore throat.   Eyes: Negative.   Respiratory: Positive for cough.   Cardiovascular: Negative.   Endocrine: Negative.   Neurological: Negative.   Psychiatric/Behavioral: Negative.      Today's Vitals   05/16/18 1536  BP: 140/72  Pulse: (!) 54  Temp: (!) 97.5 F (36.4 C)  TempSrc: Oral  SpO2: 97%  Weight: 172 lb 3.2 oz (78.1 kg)  Height: 5' 5.5" (1.664 m)  PainSc: 0-No pain   Body mass index is 28.22 kg/m.   Objective:  Physical Exam  Constitutional: She appears well-developed and well-nourished. She appears distressed.  HENT:  Head: Normocephalic and atraumatic.  Right Ear: External ear normal.  Left Ear: External ear normal.  Erythematous pharynx Left frontal sinus tender to percussion  Cardiovascular: Normal rate, regular rhythm, normal heart sounds and intact distal pulses.  Pulmonary/Chest: Effort normal and breath sounds normal.  Nursing note and vitals reviewed.       Assessment And Plan:     1. Acute non-recurrent frontal sinusitis  She was given rx amoxicillin 500mg  three times daily x 7 days. She is encouraged to complete full course of abx. She is also encouraged to avoid dairy products for the next week.   2. Hypertensive nephropathy  Fair control.  Mild elevation today. Likely related to  her acute illness. She will continue with current meds for now.   3. Chronic renal disease, stage II  This has been stable.   Maximino Greenland, MD

## 2018-06-08 ENCOUNTER — Ambulatory Visit: Payer: Medicare Other | Admitting: Podiatry

## 2018-06-19 ENCOUNTER — Ambulatory Visit: Payer: Medicare Other | Admitting: Podiatry

## 2018-06-19 ENCOUNTER — Encounter: Payer: Self-pay | Admitting: Podiatry

## 2018-06-19 DIAGNOSIS — B351 Tinea unguium: Secondary | ICD-10-CM | POA: Diagnosis not present

## 2018-06-19 DIAGNOSIS — M79675 Pain in left toe(s): Secondary | ICD-10-CM

## 2018-06-19 DIAGNOSIS — M79674 Pain in right toe(s): Secondary | ICD-10-CM | POA: Diagnosis not present

## 2018-06-19 NOTE — Patient Instructions (Addendum)

## 2018-06-22 ENCOUNTER — Encounter: Payer: Self-pay | Admitting: Podiatry

## 2018-06-22 NOTE — Progress Notes (Signed)
Subjective: Maureen Walker presents today referred by Glendale Chard, MD with h/o diabetes and cc of painful, discolored, thick toenails which interfere with daily activities.  Pain is aggravated when wearing enclosed shoe gear. Pain is relieved with periodic professional debridement.  She relates she saw Dr. Baird Cancer 2-3 months ago.  Past Medical History:  Diagnosis Date  . Colon polyps    adenomatous polyps  . Diabetes mellitus   . Diverticulosis   . Hyperlipidemia   . Hypertension   . Microcytic anemia     Patient Active Problem List   Diagnosis Date Noted  . Type 2 diabetes mellitus with stage 2 chronic kidney disease ([place]) 05/16/2018  . Chronic renal disease, stage II 05/16/2018  . Hypertensive nephropathy 05/16/2018  . Vitamin D deficiency 07/29/2016  . Nontoxic diffuse goiter 07/29/2016  . Mixed hyperlipidemia 07/29/2016  . Hypertension, essential 07/29/2016  . Skin mole 04/04/2013  . Hemorrhage of rectum and anus 05/10/2012  . Anemia 05/10/2012  . Routine health maintenance 08/02/2011    Past Surgical History:  Procedure Laterality Date  . ABDOMINAL HYSTERECTOMY  1970   with RSO    Current Outpatient Medications:  .  amoxicillin (AMOXIL) 500 MG capsule, Take 1 capsule (500 mg total) by mouth 3 (three) times daily., Disp: 21 capsule, Rfl: 0 .  aspirin EC 81 MG tablet, Take 81 mg by mouth daily., Disp: , Rfl:  .  atorvastatin (LIPITOR) 40 MG tablet, Take 40 mg by mouth daily., Disp: , Rfl:  .  Calcium Carbonate-Vitamin D (CALCIUM 600/VITAMIN D PO), Take 1 tablet by mouth daily., Disp: , Rfl:  .  Cholecalciferol (D 1000) 25 MCG (1000 UT) capsule, Take 1 capsule twice a day, Disp: , Rfl:  .  Coenzyme Q10 (CO Q 10) 100 MG CAPS, Take 1 tablet by mouth daily., Disp: , Rfl:  .  ezetimibe (ZETIA) 10 MG tablet, Take 10 mg by mouth daily., Disp: , Rfl:  .  hydrochlorothiazide (MICROZIDE) 12.5 MG capsule, Take 12.5 mg by mouth daily., Disp: , Rfl:  .  Lancets (ACCU-CHEK  MULTICLIX) lancets, Use to test blood sugars 2 times a day, Disp: , Rfl:  .  MAGNESIUM PO, Take by mouth., Disp: , Rfl:  .  metFORMIN (GLUCOPHAGE-XR) 750 MG 24 hr tablet, Take 750 mg by mouth 2 (two) times daily. , Disp: , Rfl:  .  metoprolol tartrate (LOPRESSOR) 25 MG tablet, TAKE ONE-HALF TABLET BY  MOUTH TWICE A DAY, Disp: 90 tablet, Rfl: 1 .  Multiple Vitamin (MULITIVITAMIN WITH MINERALS) TABS, Take 1 tablet by mouth daily., Disp: , Rfl:  .  Multiple Vitamin (MULTIVITAMIN) tablet, Take 1 tablet once a day, Disp: , Rfl:  .  pioglitazone (ACTOS) 45 MG tablet, Take 45 mg by mouth daily., Disp: , Rfl:  .  quinapril (ACCUPRIL) 40 MG tablet, Take 20 mg by mouth daily. , Disp: , Rfl:  .  VITAMIN D PO, Take by mouth., Disp: , Rfl:   Allergies  Allergen Reactions  . Cholestyramine Other (See Comments)    constipation    Social History   Occupational History  . Occupation: retired    Fish farm manager: RETIRED  Tobacco Use  . Smoking status: Former Smoker    Years: 8.00    Types: Cigarettes    Last attempt to quit: 09/16/1986    Years since quitting: 31.7  . Smokeless tobacco: Never Used  Substance and Sexual Activity  . Alcohol use: No    Alcohol/week: 0.0 standard drinks  .  Drug use: No  . Sexual activity: Not on file    Family History  Problem Relation Age of Onset  . Diabetes Sister        twin  . Diabetes Mother   . Heart disease Paternal Aunt   . Melanoma Father   . Stroke Paternal Grandmother   . Breast cancer Maternal Aunt        x 2 aunts    Immunization History  Administered Date(s) Administered  . Influenza,inj,Quad PF,6+ Mos 04/04/2013  . Influenza-Unspecified 03/09/2018  . Pneumococcal-Unspecified 10/12/2013, 07/01/2015  . Tdap 12/27/2011    Objective: Vascular Examination: Capillary refill time immediate x 10 digits Dorsalis pedis and posterior tibial pulses present b/l No digital hair x 10 digits Skin temperature gradient WNL b/l  Dermatological  Examination: Skin with normal turgor, texture and tone b/l  Toenails 1-5 b/l discolored, thick, dystrophic with subungual debris and pain with palpation to nailbeds due to thickness of nails.  No open wounds b/l  No interdigital macerations b/l.  Musculoskeletal: Muscle strength 5/5 to all LE muscle groups  Neurological: Sensation intact with 10 gram monofilament Vibratory sensation intact.  Assessment: 1. Painful onychomycosis toenails 1-5 b/l   Plan: 1. Continue diabetic foot care principles. Literature dispensed on today. 2. Toenails 1-5 b/l were debrided in length and girth without iatrogenic bleeding. 3. Patient to continue soft, supportive shoe gear 4. Patient to report any pedal injuries to medical professional immediately. 5. Follow up 3 months. Patient/POA to call should there be a concern in the interim.

## 2018-07-06 ENCOUNTER — Ambulatory Visit: Payer: Medicare Other | Admitting: Internal Medicine

## 2018-07-06 ENCOUNTER — Encounter: Payer: Self-pay | Admitting: Internal Medicine

## 2018-07-06 VITALS — BP 124/70 | HR 49 | Temp 98.0°F | Ht 65.5 in | Wt 171.0 lb

## 2018-07-06 DIAGNOSIS — N182 Chronic kidney disease, stage 2 (mild): Secondary | ICD-10-CM

## 2018-07-06 DIAGNOSIS — E7800 Pure hypercholesterolemia, unspecified: Secondary | ICD-10-CM | POA: Diagnosis not present

## 2018-07-06 DIAGNOSIS — I129 Hypertensive chronic kidney disease with stage 1 through stage 4 chronic kidney disease, or unspecified chronic kidney disease: Secondary | ICD-10-CM | POA: Diagnosis not present

## 2018-07-06 DIAGNOSIS — J3089 Other allergic rhinitis: Secondary | ICD-10-CM

## 2018-07-06 DIAGNOSIS — E1122 Type 2 diabetes mellitus with diabetic chronic kidney disease: Secondary | ICD-10-CM | POA: Diagnosis not present

## 2018-07-06 MED ORDER — FLUTICASONE PROPIONATE 50 MCG/ACT NA SUSP: 1.0000 | Freq: Every day | NASAL | 2 refills | Status: AC

## 2018-07-06 MED ORDER — LORATADINE 10 MG PO TABS: 10.0000 mg | ORAL_TABLET | Freq: Every day | ORAL | 2 refills | Status: AC

## 2018-07-06 NOTE — Patient Instructions (Signed)
DASH Eating Plan  DASH stands for "Dietary Approaches to Stop Hypertension." The DASH eating plan is a healthy eating plan that has been shown to reduce high blood pressure (hypertension). It may also reduce your risk for type 2 diabetes, heart disease, and stroke. The DASH eating plan may also help with weight loss.  What are tips for following this plan?    General guidelines   Avoid eating more than 2,300 mg (milligrams) of salt (sodium) a day. If you have hypertension, you may need to reduce your sodium intake to 1,500 mg a day.   Limit alcohol intake to no more than 1 drink a day for nonpregnant women and 2 drinks a day for men. One drink equals 12 oz of beer, 5 oz of wine, or 1 oz of hard liquor.   Work with your health care provider to maintain a healthy body weight or to lose weight. Ask what an ideal weight is for you.   Get at least 30 minutes of exercise that causes your heart to beat faster (aerobic exercise) most days of the week. Activities may include walking, swimming, or biking.   Work with your health care provider or diet and nutrition specialist (dietitian) to adjust your eating plan to your individual calorie needs.  Reading food labels     Check food labels for the amount of sodium per serving. Choose foods with less than 5 percent of the Daily Value of sodium. Generally, foods with less than 300 mg of sodium per serving fit into this eating plan.   To find whole grains, look for the word "whole" as the first word in the ingredient list.  Shopping   Buy products labeled as "low-sodium" or "no salt added."   Buy fresh foods. Avoid canned foods and premade or frozen meals.  Cooking   Avoid adding salt when cooking. Use salt-free seasonings or herbs instead of table salt or sea salt. Check with your health care provider or pharmacist before using salt substitutes.   Do not fry foods. Cook foods using healthy methods such as baking, boiling, grilling, and broiling instead.   Cook with  heart-healthy oils, such as olive, canola, soybean, or sunflower oil.  Meal planning   Eat a balanced diet that includes:  ? 5 or more servings of fruits and vegetables each day. At each meal, try to fill half of your plate with fruits and vegetables.  ? Up to 6-8 servings of whole grains each day.  ? Less than 6 oz of lean meat, poultry, or fish each day. A 3-oz serving of meat is about the same size as a deck of cards. One egg equals 1 oz.  ? 2 servings of low-fat dairy each day.  ? A serving of nuts, seeds, or beans 5 times each week.  ? Heart-healthy fats. Healthy fats called Omega-3 fatty acids are found in foods such as flaxseeds and coldwater fish, like sardines, salmon, and mackerel.   Limit how much you eat of the following:  ? Canned or prepackaged foods.  ? Food that is high in trans fat, such as fried foods.  ? Food that is high in saturated fat, such as fatty meat.  ? Sweets, desserts, sugary drinks, and other foods with added sugar.  ? Full-fat dairy products.   Do not salt foods before eating.   Try to eat at least 2 vegetarian meals each week.   Eat more home-cooked food and less restaurant, buffet, and fast food.     When eating at a restaurant, ask that your food be prepared with less salt or no salt, if possible.  What foods are recommended?  The items listed may not be a complete list. Talk with your dietitian about what dietary choices are best for you.  Grains  Whole-grain or whole-wheat bread. Whole-grain or whole-wheat pasta. Brown rice. Oatmeal. Quinoa. Bulgur. Whole-grain and low-sodium cereals. Pita bread. Low-fat, low-sodium crackers. Whole-wheat flour tortillas.  Vegetables  Fresh or frozen vegetables (raw, steamed, roasted, or grilled). Low-sodium or reduced-sodium tomato and vegetable juice. Low-sodium or reduced-sodium tomato sauce and tomato paste. Low-sodium or reduced-sodium canned vegetables.  Fruits  All fresh, dried, or frozen fruit. Canned fruit in natural juice (without  added sugar).  Meat and other protein foods  Skinless chicken or turkey. Ground chicken or turkey. Pork with fat trimmed off. Fish and seafood. Egg whites. Dried beans, peas, or lentils. Unsalted nuts, nut butters, and seeds. Unsalted canned beans. Lean cuts of beef with fat trimmed off. Low-sodium, lean deli meat.  Dairy  Low-fat (1%) or fat-free (skim) milk. Fat-free, low-fat, or reduced-fat cheeses. Nonfat, low-sodium ricotta or cottage cheese. Low-fat or nonfat yogurt. Low-fat, low-sodium cheese.  Fats and oils  Soft margarine without trans fats. Vegetable oil. Low-fat, reduced-fat, or light mayonnaise and salad dressings (reduced-sodium). Canola, safflower, olive, soybean, and sunflower oils. Avocado.  Seasoning and other foods  Herbs. Spices. Seasoning mixes without salt. Unsalted popcorn and pretzels. Fat-free sweets.  What foods are not recommended?  The items listed may not be a complete list. Talk with your dietitian about what dietary choices are best for you.  Grains  Baked goods made with fat, such as croissants, muffins, or some breads. Dry pasta or rice meal packs.  Vegetables  Creamed or fried vegetables. Vegetables in a cheese sauce. Regular canned vegetables (not low-sodium or reduced-sodium). Regular canned tomato sauce and paste (not low-sodium or reduced-sodium). Regular tomato and vegetable juice (not low-sodium or reduced-sodium). Pickles. Olives.  Fruits  Canned fruit in a light or heavy syrup. Fried fruit. Fruit in cream or butter sauce.  Meat and other protein foods  Fatty cuts of meat. Ribs. Fried meat. Bacon. Sausage. Bologna and other processed lunch meats. Salami. Fatback. Hotdogs. Bratwurst. Salted nuts and seeds. Canned beans with added salt. Canned or smoked fish. Whole eggs or egg yolks. Chicken or turkey with skin.  Dairy  Whole or 2% milk, cream, and half-and-half. Whole or full-fat cream cheese. Whole-fat or sweetened yogurt. Full-fat cheese. Nondairy creamers. Whipped toppings.  Processed cheese and cheese spreads.  Fats and oils  Butter. Stick margarine. Lard. Shortening. Ghee. Bacon fat. Tropical oils, such as coconut, palm kernel, or palm oil.  Seasoning and other foods  Salted popcorn and pretzels. Onion salt, garlic salt, seasoned salt, table salt, and sea salt. Worcestershire sauce. Tartar sauce. Barbecue sauce. Teriyaki sauce. Soy sauce, including reduced-sodium. Steak sauce. Canned and packaged gravies. Fish sauce. Oyster sauce. Cocktail sauce. Horseradish that you find on the shelf. Ketchup. Mustard. Meat flavorings and tenderizers. Bouillon cubes. Hot sauce and Tabasco sauce. Premade or packaged marinades. Premade or packaged taco seasonings. Relishes. Regular salad dressings.  Where to find more information:   National Heart, Lung, and Blood Institute: www.nhlbi.nih.gov   American Heart Association: www.heart.org  Summary   The DASH eating plan is a healthy eating plan that has been shown to reduce high blood pressure (hypertension). It may also reduce your risk for type 2 diabetes, heart disease, and stroke.   With the   DASH eating plan, you should limit salt (sodium) intake to 2,300 mg a day. If you have hypertension, you may need to reduce your sodium intake to 1,500 mg a day.   When on the DASH eating plan, aim to eat more fresh fruits and vegetables, whole grains, lean proteins, low-fat dairy, and heart-healthy fats.   Work with your health care provider or diet and nutrition specialist (dietitian) to adjust your eating plan to your individual calorie needs.  This information is not intended to replace advice given to you by your health care provider. Make sure you discuss any questions you have with your health care provider.  Document Released: 06/03/2011 Document Revised: 06/07/2016 Document Reviewed: 06/07/2016  [employer]vier Interactive Patient Education  2019 [employer]

## 2018-07-07 LAB — CMP14+EGFR
ALBUMIN: 4.6 g/dL (ref 3.5–4.7)
ALT: 14 IU/L (ref 0–32)
AST: 22 IU/L (ref 0–40)
Albumin/Globulin Ratio: 2.3 — ABNORMAL HIGH (ref 1.2–2.2)
Alkaline Phosphatase: 46 IU/L (ref 39–117)
BUN/Creatinine Ratio: 17 (ref 12–28)
BUN: 14 mg/dL (ref 8–27)
Bilirubin Total: 0.4 mg/dL (ref 0.0–1.2)
CALCIUM: 9.9 mg/dL (ref 8.7–10.3)
CO2: 27 mmol/L (ref 20–29)
Chloride: 97 mmol/L (ref 96–106)
Creatinine, Ser: 0.84 mg/dL (ref 0.57–1.00)
GFR calc Af Amer: 74 mL/min/{1.73_m2} (ref >59)
GFR, EST NON AFRICAN AMERICAN: 64 mL/min/{1.73_m2} (ref >59)
GLUCOSE: 92 mg/dL (ref 65–99)
Globulin, Total: 2 g/dL (ref 1.5–4.5)
Potassium: 4.4 mmol/L (ref 3.5–5.2)
Sodium: 138 mmol/L (ref 134–144)
Total Protein: 6.6 g/dL (ref 6.0–8.5)

## 2018-07-07 LAB — LIPID PANEL
Chol/HDL Ratio: 1.9 ratio (ref 0.0–4.4)
Cholesterol, Total: 129 mg/dL (ref 100–199)
HDL: 68 mg/dL (ref >39)
LDL Calculated: 54 mg/dL (ref 0–99)
Triglycerides: 35 mg/dL (ref 0–149)
VLDL Cholesterol Cal: 7 mg/dL (ref 5–40)

## 2018-07-07 LAB — HEMOGLOBIN A1C
Est. average glucose Bld gHb Est-mCnc: 128 mg/dL
HEMOGLOBIN A1C: 6.1 % — AB (ref 4.8–5.6)

## 2018-07-10 NOTE — Progress Notes (Signed)
Your liver and kidney fxn are stable. Your hba1c is 6.1, this is great. Your chol is great. Continue with current meds.

## 2018-07-11 ENCOUNTER — Encounter: Payer: Self-pay | Admitting: Internal Medicine

## 2018-07-13 ENCOUNTER — Emergency Department (HOSPITAL_COMMUNITY)
Admission: EM | Admit: 2018-07-13 | Discharge: 2018-07-13 | Disposition: A | Discharge status: Home / Self Care | Payer: Medicare Other | Attending: Emergency Medicine | Admitting: Emergency Medicine

## 2018-07-13 ENCOUNTER — Other Ambulatory Visit: Payer: Self-pay

## 2018-07-13 ENCOUNTER — Emergency Department (HOSPITAL_COMMUNITY): Payer: Medicare Other

## 2018-07-13 ENCOUNTER — Encounter (HOSPITAL_COMMUNITY): Payer: Self-pay

## 2018-07-13 DIAGNOSIS — N182 Chronic kidney disease, stage 2 (mild): Secondary | ICD-10-CM | POA: Insufficient documentation

## 2018-07-13 DIAGNOSIS — E1122 Type 2 diabetes mellitus with diabetic chronic kidney disease: Secondary | ICD-10-CM | POA: Diagnosis not present

## 2018-07-13 DIAGNOSIS — Z79899 Other long term (current) drug therapy: Secondary | ICD-10-CM | POA: Diagnosis not present

## 2018-07-13 DIAGNOSIS — Z7982 Long term (current) use of aspirin: Secondary | ICD-10-CM | POA: Diagnosis not present

## 2018-07-13 DIAGNOSIS — Z87891 Personal history of nicotine dependence: Secondary | ICD-10-CM | POA: Diagnosis not present

## 2018-07-13 DIAGNOSIS — M25511 Pain in right shoulder: Secondary | ICD-10-CM | POA: Insufficient documentation

## 2018-07-13 DIAGNOSIS — Z7984 Long term (current) use of oral hypoglycemic drugs: Secondary | ICD-10-CM | POA: Diagnosis not present

## 2018-07-13 DIAGNOSIS — I129 Hypertensive chronic kidney disease with stage 1 through stage 4 chronic kidney disease, or unspecified chronic kidney disease: Secondary | ICD-10-CM | POA: Diagnosis present

## 2018-07-13 NOTE — ED Triage Notes (Signed)
Patient  reports that she checks her BP at home and it has been elevated. Patient states she has been having right shoulder pain x 1 week. Patient states she has been doing a lot of exercise.

## 2018-07-13 NOTE — Discharge Instructions (Signed)
As discussed, your evaluation today has been largely reassuring.  But, it is important that you monitor your condition carefully, and do not hesitate to return to the ED if you develop new, or concerning changes in your condition.  Otherwise, please follow-up with your physician for appropriate ongoing care.  To improve the pain in your shoulder, please use ice packs, Tylenol or ibuprofen, and minimize activities with dynamic motion of the shoulder itself. If your pain is not improved in the next week or so, please consider following up with your physician or our orthopedic colleagues.

## 2018-07-13 NOTE — ED Provider Notes (Signed)
[facility] DEPT Provider Note   CSN: [id number] Arrival date & time: 07/13/18  1312     History   Chief Complaint Chief Complaint  Patient presents with  . Hypertension  . Shoulder Pain    HPI Maureen Walker is a 85 y.o. female.  HPI Patient presents with concern of shoulder pain, and questions about hypertension. Patient has a history of hypertension, has had no recent changes in her medication. She notes that seeing her physician last week, and again earlier today she had elevated readings, though these were normal just prior to ED arrival. She denies chest pain, abdominal pain, nausea, vomiting, lightheadedness, syncope, or other systemic changes from baseline. She states that" I feel good" when asked how she is doing in general. She also complains of pain in her right shoulder.  Onset seems to have been over the past few days. Pain is focally in the right anterior superior lateral shoulder, worse with activity, though with some discomfort about the right brachial plexus as well. No central neck pain, no difficulty with range of motion, no new weakness in any extremity. Patient exercises up to 4 times weekly, typically performing water aerobics, another exercises.  Past Medical History:  Diagnosis Date  . Colon polyps    adenomatous polyps  . Diabetes mellitus   . Diverticulosis   . Hyperlipidemia   . Hypertension   . Microcytic anemia     Patient Active Problem List   Diagnosis Date Noted  . Type 2 diabetes mellitus with stage 2 chronic kidney disease ([place]) 05/16/2018  . Chronic renal disease, stage II 05/16/2018  . Hypertensive nephropathy 05/16/2018  . Vitamin D deficiency 07/29/2016  . Nontoxic diffuse goiter 07/29/2016  . Mixed hyperlipidemia 07/29/2016  . Hypertension, essential 07/29/2016  . Skin mole 04/04/2013  . Hemorrhage of rectum and anus 05/10/2012  . Anemia 05/10/2012  . Routine health maintenance 08/02/2011     Past Surgical History:  Procedure Laterality Date  . ABDOMINAL HYSTERECTOMY  1970   with RSO     OB History    Gravida  1   Para      Term      Preterm      AB  1   Living  0     SAB  1   TAB      Ectopic      Multiple      Live Births               Home Medications    Prior to Admission medications   Medication Sig Start Date End Date Taking? Authorizing Provider  aspirin EC 81 MG tablet Take 81 mg by mouth daily.    [provider]  atorvastatin (LIPITOR) 40 MG tablet Take 40 mg by mouth daily.    [provider]  CALCIUM PO Take by mouth.    [provider]  Cholecalciferol (D 1000) 25 MCG (1000 UT) capsule Take 1 capsule twice a day    [provider]  Coenzyme Q10 (CO Q 10) 100 MG CAPS Take 1 tablet by mouth daily.    [provider]  ezetimibe (ZETIA) 10 MG tablet Take 10 mg by mouth daily.    [provider]  fluticasone (FLONASE) 50 MCG/ACT nasal spray Place 1 spray into both nostrils daily. 07/06/18 07/06/19  Glendale Chard, MD  hydrochlorothiazide (MICROZIDE) 12.5 MG capsule Take 12.5 mg by mouth daily.    [provider]  Lancets (ACCU-CHEK MULTICLIX) lancets Use to test blood sugars 2 times a day    [provider]  loratadine (CLARITIN) 10 MG tablet Take 1 tablet (10 mg total) by mouth daily. 07/06/18 07/06/19  Glendale Chard, MD  MAGNESIUM PO Take by mouth.    [provider]  metFORMIN (GLUCOPHAGE-XR) 750 MG 24 hr tablet Take 750 mg by mouth 2 (two) times daily.     [provider]  metoprolol tartrate (LOPRESSOR) 25 MG tablet TAKE ONE-HALF TABLET BY  MOUTH TWICE A DAY 05/09/18   Glendale Chard, MD  Multiple Vitamin (MULTIVITAMIN) tablet Take 1 tablet once a day    [provider]  pioglitazone (ACTOS) 45 MG tablet Take 45 mg by mouth daily.    [provider]  quinapril (ACCUPRIL) 40 MG tablet Take 20 mg by mouth daily.     [provider]    Family History Family History  Problem Relation Age of Onset  . Diabetes Sister        twin  . Diabetes Mother   . Heart disease Paternal Aunt   . Melanoma Father   . Stroke Paternal Grandmother   . Breast cancer Maternal Aunt        x 2 aunts    Social History Social History   Tobacco Use  . Smoking status: Former Smoker    Years: 8.00    Types: Cigarettes    Last attempt to quit: 09/16/1986    Years since quitting: 31.8  . Smokeless tobacco: Never Used  Substance Use Topics  . Alcohol use: No    Alcohol/week: 0.0 standard drinks  . Drug use: No     Allergies   Cholestyramine   Review of Systems Review of Systems  Constitutional:       Per HPI, otherwise negative  HENT:       Per HPI, otherwise negative  Respiratory:       Per HPI, otherwise negative  Cardiovascular:       Per HPI, otherwise negative  Gastrointestinal: Negative for vomiting.  Endocrine:       Negative aside from HPI  Genitourinary:       Neg aside from HPI   Musculoskeletal:       Per HPI, otherwise negative  Skin: Negative.   Neurological: Negative for syncope.     Physical Exam Updated Vital Signs BP 128/61 (BP Location: Left Arm)   Pulse 68   Temp 97.9 F (36.6 C) (Oral)   Resp 18   Ht 5' 5.5" (1.664 m)   Wt 77.6 kg   SpO2 98%   BMI 28.02 kg/m   Physical Exam Vitals signs and nursing note reviewed.  Constitutional:      General: She is not in acute distress.    Appearance: She is well-developed.  HENT:     Head: Normocephalic and atraumatic.  Eyes:     Conjunctiva/sclera: Conjunctivae normal.  Neck:     Musculoskeletal: Full passive range of motion without pain and neck supple. Normal range of motion. No edema, erythema, neck rigidity, crepitus, pain with movement, spinous process tenderness or muscular tenderness.  Cardiovascular:     Rate and Rhythm: Normal rate and regular rhythm.  Pulmonary:     Effort: Pulmonary effort is normal. No  respiratory distress.     Breath sounds: Normal breath sounds. No stridor.  Abdominal:     General: There is no distension.  Musculoskeletal:       Arms:  Skin:    General: Skin is warm and dry.  Neurological:     Mental Status: She is alert and oriented to person, place, and time.     Cranial Nerves: No cranial nerve deficit.      ED Treatments / Results   Radiology Dg Shoulder Right  Result Date: 07/13/2018 CLINICAL DATA:  Right shoulder pain.  No known injury. EXAM: RIGHT SHOULDER - 2+ VIEW COMPARISON:  No recent. FINDINGS: Acromioclavicular and glenohumeral degenerative change. No acute bony abnormality. IMPRESSION: Acromioclavicular glenohumeral degenerative change. No acute abnormality. Electronically Signed   By: Marcello Moores  Register   On: 07/13/2018 13:49    Procedures Procedures (including critical care time)  Medications Ordered in ED Medications - No data to display   Initial Impression / Assessment and Plan / ED Course  I have reviewed the triage vital signs and the nursing notes.  Pertinent labs & imaging results that were available during my care of the patient were reviewed by me and considered in my medical decision making (see chart for details).  After the initial evaluation we reviewed the x-ray images together, and illustrated the changes identified in the interpretation. On exam patient does have some tenderness about the area near the acromioclavicular joint, and given her description of new type of exercises in her water aerobics class, there is some suspicion for musculoskeletal injury. Blood pressure is 120/65, no evidence for endorgan effects given her absence of distress, stating that she feels good in general, absence of hemodynamic instability. Patient will use cryotherapy, ice, ibuprofen, follow-up with primary care as needed.  Final Clinical Impressions(s) / ED Diagnoses   Final diagnoses:  Acute pain of right shoulder     Carmin Muskrat,  MD 07/13/18 [id number]

## 2018-07-17 ENCOUNTER — Encounter: Payer: Self-pay | Admitting: Internal Medicine

## 2018-07-17 NOTE — Progress Notes (Addendum)
Subjective:     Patient ID: Maureen Walker , female    DOB: May 15, 1933 , 85 y.o.   MRN: [id number]   Chief Complaint  Patient presents with  . Hyperlipidemia  . Diabetes    HPI  She is currently taking atorvastatin without any issues.  She denies muscle aches, pains and spasms.   Diabetes  She presents for her follow-up diabetic visit. She has type 2 diabetes mellitus. Her disease course has been stable. There are no hypoglycemic associated symptoms. There are no hypoglycemic complications. Risk factors for coronary artery disease include diabetes mellitus, dyslipidemia, hypertension and post-menopausal. Her breakfast blood glucose is taken between 8-9 am. Her breakfast blood glucose range is generally 110-130 mg/dl.     Past Medical History:  Diagnosis Date  . Colon polyps    adenomatous polyps  . Diabetes mellitus   . Diverticulosis   . Hyperlipidemia   . Hypertension   . Microcytic anemia      Family History  Problem Relation Age of Onset  . Diabetes Sister        twin  . Diabetes Mother   . Heart disease Paternal Aunt   . Melanoma Father   . Stroke Paternal Grandmother   . Breast cancer Maternal Aunt        x 2 aunts     Current Outpatient Medications:  .  aspirin EC 81 MG tablet, Take 81 mg by mouth daily., Disp: , Rfl:  .  CALCIUM PO, Take by mouth., Disp: , Rfl:  .  Cholecalciferol (D 1000) 25 MCG (1000 UT) capsule, Take 1 capsule twice a day, Disp: , Rfl:  .  Coenzyme Q10 (CO Q 10) 100 MG CAPS, Take 1 tablet by mouth daily., Disp: , Rfl:  .  ezetimibe (ZETIA) 10 MG tablet, Take 10 mg by mouth daily., Disp: , Rfl:  .  hydrochlorothiazide (MICROZIDE) 12.5 MG capsule, Take 12.5 mg by mouth daily., Disp: , Rfl:  .  Lancets (ACCU-CHEK MULTICLIX) lancets, Use to test blood sugars 2 times a day, Disp: , Rfl:  .  MAGNESIUM PO, Take by mouth., Disp: , Rfl:  .  metFORMIN (GLUCOPHAGE-XR) 750 MG 24 hr tablet, Take 750 mg by mouth 2 (two) times daily. , Disp: , Rfl:   .  metoprolol tartrate (LOPRESSOR) 25 MG tablet, TAKE ONE-HALF TABLET BY  MOUTH TWICE A DAY, Disp: 90 tablet, Rfl: 1 .  Multiple Vitamin (MULTIVITAMIN) tablet, Take 1 tablet once a day, Disp: , Rfl:  .  pioglitazone (ACTOS) 45 MG tablet, Take 45 mg by mouth daily., Disp: , Rfl:  .  quinapril (ACCUPRIL) 40 MG tablet, Take 20 mg by mouth daily. , Disp: , Rfl:  .  atorvastatin (LIPITOR) 40 MG tablet, Take 40 mg by mouth daily., Disp: , Rfl:  .  fluticasone (FLONASE) 50 MCG/ACT nasal spray, Place 1 spray into both nostrils daily., Disp: 16 g, Rfl: 2 .  loratadine (CLARITIN) 10 MG tablet, Take 1 tablet (10 mg total) by mouth daily., Disp: 30 tablet, Rfl: 2   Allergies  Allergen Reactions  . Cholestyramine Other (See Comments)    constipation     Review of Systems  Constitutional: Negative.   Respiratory: Negative.   Cardiovascular: Negative.   Gastrointestinal: Negative.   Neurological: Negative.   Psychiatric/Behavioral: Negative.      Today's Vitals   07/06/18 1025  BP: 124/70  Pulse: (!) 49  Temp: 98 F (36.7 C)  TempSrc: Oral  Weight: 171 lb (  77.6 kg)  Height: 5' 5.5" (1.664 m)   Body mass index is 28.02 kg/m.   Objective:  Physical Exam Vitals signs and nursing note reviewed.  Constitutional:      Appearance: Normal appearance.  HENT:     Head: Normocephalic and atraumatic.  Cardiovascular:     Rate and Rhythm: Normal rate and regular rhythm.     Heart sounds: Normal heart sounds.  Pulmonary:     Effort: Pulmonary effort is normal.     Breath sounds: Normal breath sounds.  Skin:    General: Skin is warm.  Neurological:     General: No focal deficit present.     Mental Status: She is alert.         Assessment And Plan:     1. Pure hypercholesterolemia  I will check lipid panel today, she is not fasting. She is encouraged to limit her intake of fried foods and to exercise 30 minutes five days weekly.  - Lipid Profile  2. Type 2 diabetes mellitus with  stage 2 chronic kidney disease, without long-term current use of insulin ([place])  I will check labs as listed below. I will be sure to forward her labs to her endocrinologist. Importance of regular exercise was discussed with the patient.  - CMP14+EGFR - Hemoglobin A1c  3. Chronic renal disease, stage II  Chronic. I will check a GFR, Cr today. She is encouraged to stay well hydrated.   4. Hypertensive nephropathy  Well controlled. She will continue with current meds. She is encouraged to avoid adding salt to her foods.   5. Non-seasonal allergic rhinitis due to other allergic trigger  She will continue with flonase prn.   Maximino Greenland, MD

## 2018-08-03 ENCOUNTER — Ambulatory Visit: Payer: Medicare Other | Admitting: Internal Medicine

## 2018-09-18 ENCOUNTER — Ambulatory Visit: Payer: Medicare Other | Admitting: Podiatry

## 2018-10-04 ENCOUNTER — Ambulatory Visit: Payer: Medicare Other | Admitting: Internal Medicine

## 2018-10-11 ENCOUNTER — Ambulatory Visit: Payer: Medicare Other

## 2018-10-11 ENCOUNTER — Ambulatory Visit: Payer: Medicare Other | Admitting: Internal Medicine

## 2018-10-12 ENCOUNTER — Telehealth: Payer: Self-pay | Admitting: Internal Medicine

## 2018-10-12 NOTE — Telephone Encounter (Signed)
I called the patient about AWV over video and she agreed.  She said that she now lives in a retirement center in [place]. VDM (DD)

## 2018-10-17 ENCOUNTER — Ambulatory Visit: Payer: Medicare Other

## 2018-10-17 ENCOUNTER — Telehealth: Payer: Self-pay

## 2018-10-17 ENCOUNTER — Ambulatory Visit: Payer: Medicare Other | Admitting: Internal Medicine

## 2018-10-17 NOTE — Telephone Encounter (Signed)
Called patient in order to see if she was planning on coming in for appointment of going to do a virtual appointment.  Patient stated she was planning on a virtual one.  I sent her the link to perform the appointment. After a few minutes of her not connecting I called  Her back.  She stated that there was no link for her to press and was unable to connect to our visit. Told her I understood and that we would touch bases with her to schedule another appointment at a later date.

## 2018-10-17 NOTE — Telephone Encounter (Signed)
I left the pt a message on her cell phone that Dr. Baird Cancer said that she can do a phone visit with the pt tomorrow for a f/u on her diabetes and blood pressure.

## 2018-10-22 ENCOUNTER — Other Ambulatory Visit: Payer: Self-pay | Admitting: Internal Medicine

## 2018-11-15 ENCOUNTER — Other Ambulatory Visit: Payer: Self-pay | Admitting: Internal Medicine

## 2018-11-16 ENCOUNTER — Ambulatory Visit: Payer: Medicare Other | Admitting: Internal Medicine

## 2018-11-16 ENCOUNTER — Ambulatory Visit: Payer: Medicare Other

## 2019-01-19 ENCOUNTER — Telehealth: Payer: Self-pay | Admitting: Internal Medicine

## 2019-01-19 NOTE — Telephone Encounter (Signed)
I called the patient to reschedule her AWV with [place].  She said that she lives in [place] now and has a new provider, so she won't be seeing Dr. Baird Cancer anymore.  She didn't give me the name of the new provider. VDM (DD)

## 2019-01-19 NOTE — Telephone Encounter (Signed)
Please update her chart. She has moved to [place] and is no longer a patient here.

## 2019-02-19 ENCOUNTER — Other Ambulatory Visit: Payer: Self-pay | Admitting: Student

## 2019-02-19 ENCOUNTER — Other Ambulatory Visit: Payer: Self-pay | Admitting: Family Medicine

## 2019-02-19 DIAGNOSIS — Z1231 Encounter for screening mammogram for malignant neoplasm of breast: Secondary | ICD-10-CM

## 2019-02-20 ENCOUNTER — Ambulatory Visit
Admission: RE | Admit: 2019-02-20 | Discharge: 2019-02-20 | Disposition: A | Discharge status: Home / Self Care | Payer: Medicare Other | Source: Ambulatory Visit | Attending: Family Medicine | Admitting: Family Medicine

## 2019-02-20 ENCOUNTER — Ambulatory Visit: Payer: Medicare Other | Admitting: Internal Medicine

## 2019-02-20 ENCOUNTER — Ambulatory Visit: Payer: Medicare Other

## 2019-02-20 ENCOUNTER — Other Ambulatory Visit: Payer: Self-pay

## 2019-02-20 DIAGNOSIS — Z1231 Encounter for screening mammogram for malignant neoplasm of breast: Secondary | ICD-10-CM

## 2019-04-26 ENCOUNTER — Encounter: Payer: Self-pay | Admitting: Gastroenterology

## 2019-07-17 DIAGNOSIS — R930 Abnormal findings on diagnostic imaging of skull and head, not elsewhere classified: Secondary | ICD-10-CM | POA: Diagnosis not present

## 2019-07-17 DIAGNOSIS — R519 Headache, unspecified: Secondary | ICD-10-CM | POA: Diagnosis not present

## 2019-07-17 DIAGNOSIS — J329 Chronic sinusitis, unspecified: Secondary | ICD-10-CM | POA: Diagnosis not present

## 2019-07-31 DIAGNOSIS — J323 Chronic sphenoidal sinusitis: Secondary | ICD-10-CM | POA: Diagnosis not present

## 2019-07-31 DIAGNOSIS — R519 Headache, unspecified: Secondary | ICD-10-CM | POA: Diagnosis not present

## 2019-07-31 DIAGNOSIS — J3089 Other allergic rhinitis: Secondary | ICD-10-CM | POA: Diagnosis not present

## 2019-07-31 DIAGNOSIS — I10 Essential (primary) hypertension: Secondary | ICD-10-CM | POA: Diagnosis not present

## 2019-08-01 DIAGNOSIS — I10 Essential (primary) hypertension: Secondary | ICD-10-CM | POA: Diagnosis not present

## 2019-08-01 DIAGNOSIS — R519 Headache, unspecified: Secondary | ICD-10-CM | POA: Diagnosis not present

## 2019-08-01 DIAGNOSIS — E782 Mixed hyperlipidemia: Secondary | ICD-10-CM | POA: Diagnosis not present

## 2019-08-01 DIAGNOSIS — E1159 Type 2 diabetes mellitus with other circulatory complications: Secondary | ICD-10-CM | POA: Diagnosis not present

## 2019-08-08 DIAGNOSIS — Z20828 Contact with and (suspected) exposure to other viral communicable diseases: Secondary | ICD-10-CM | POA: Diagnosis not present

## 2019-09-06 DIAGNOSIS — J3089 Other allergic rhinitis: Secondary | ICD-10-CM | POA: Diagnosis not present

## 2019-09-06 DIAGNOSIS — J323 Chronic sphenoidal sinusitis: Secondary | ICD-10-CM | POA: Diagnosis not present

## 2019-09-06 DIAGNOSIS — I10 Essential (primary) hypertension: Secondary | ICD-10-CM | POA: Diagnosis not present

## 2019-09-11 DIAGNOSIS — R0981 Nasal congestion: Secondary | ICD-10-CM | POA: Diagnosis not present

## 2019-09-11 DIAGNOSIS — R930 Abnormal findings on diagnostic imaging of skull and head, not elsewhere classified: Secondary | ICD-10-CM | POA: Diagnosis not present

## 2019-09-11 DIAGNOSIS — R519 Headache, unspecified: Secondary | ICD-10-CM | POA: Diagnosis not present

## 2019-09-11 DIAGNOSIS — I10 Essential (primary) hypertension: Secondary | ICD-10-CM | POA: Diagnosis not present

## 2021-07-06 IMAGING — MG DIGITAL SCREENING BILATERAL MAMMOGRAM WITH TOMO AND CAD
8 series · 8 of 24 positions shown · non-contrast
Comparison: Previous exam(s).

CLINICAL DATA: Screening.

EXAM:
DIGITAL SCREENING BILATERAL MAMMOGRAM WITH TOMO AND CAD

[R MLO synth-2D]
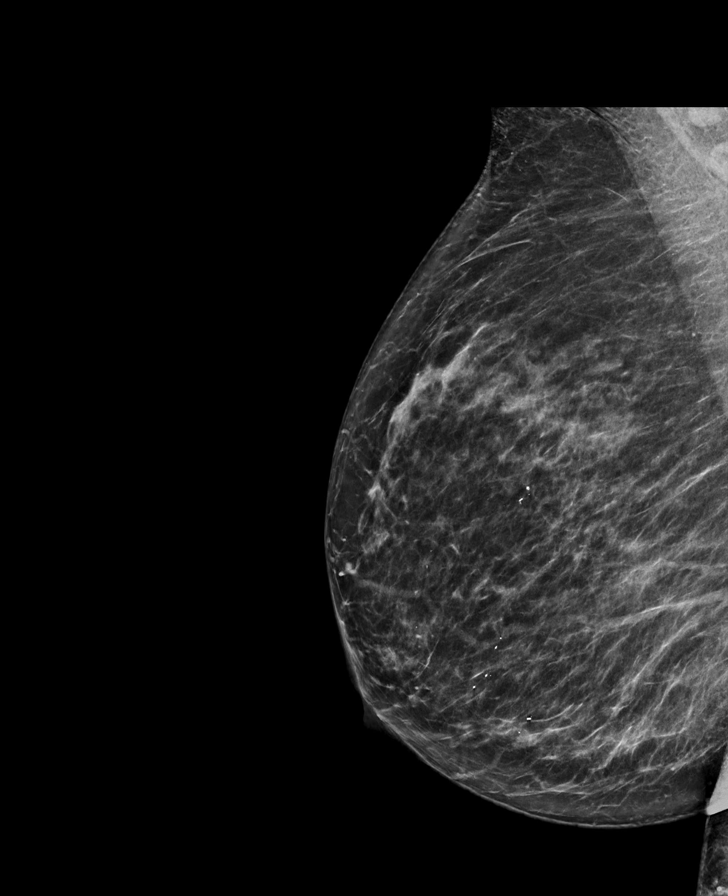

[R CC synth-2D]
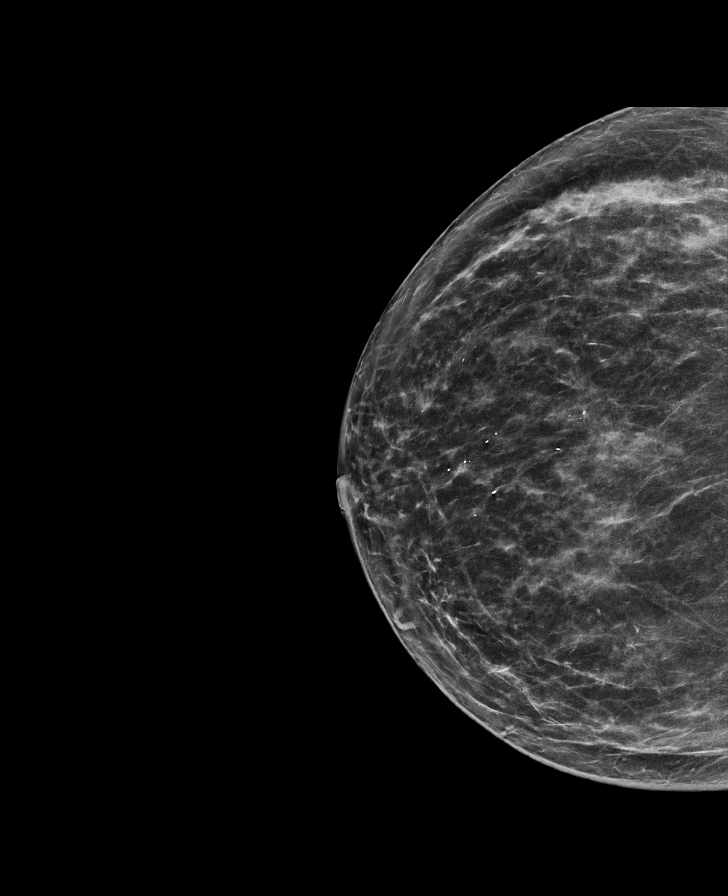

[L CC synth-2D]
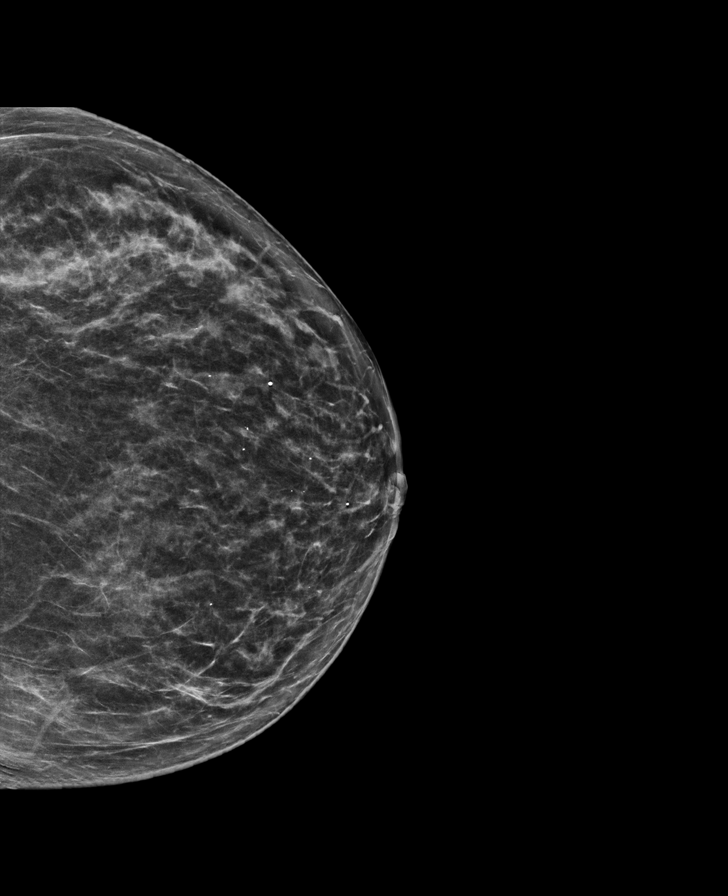

[L MLO synth-2D]
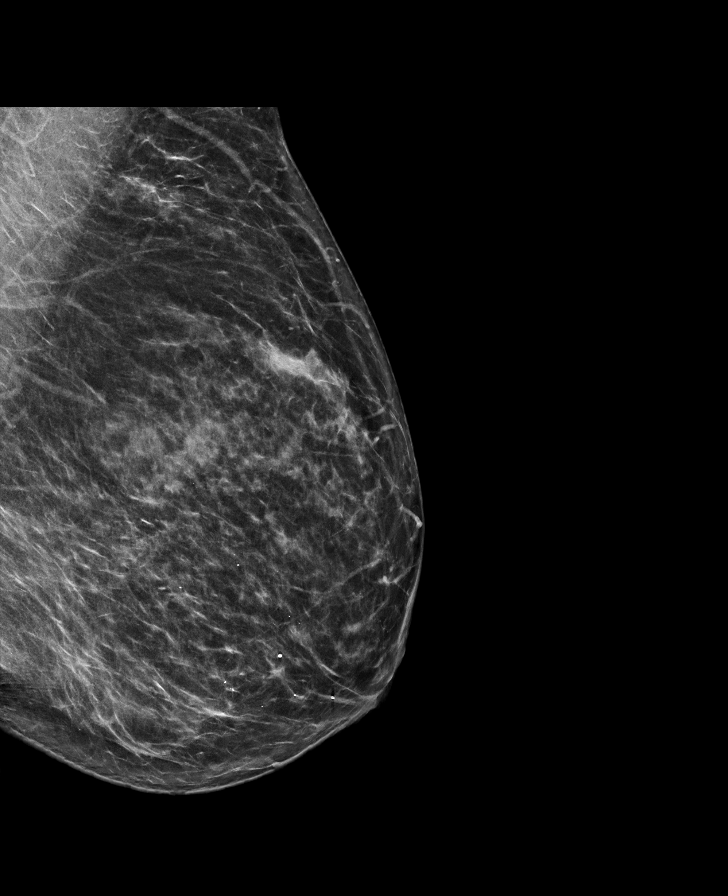

[R MLO tomo · tomo slice 39/76.0]
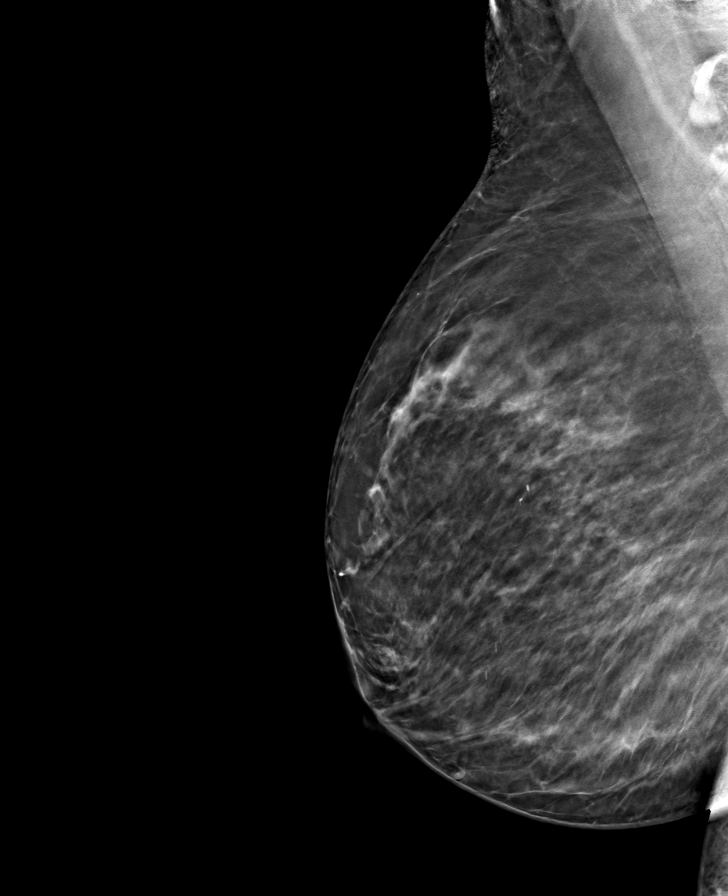

[R CC tomo · tomo slice 35/70.0]
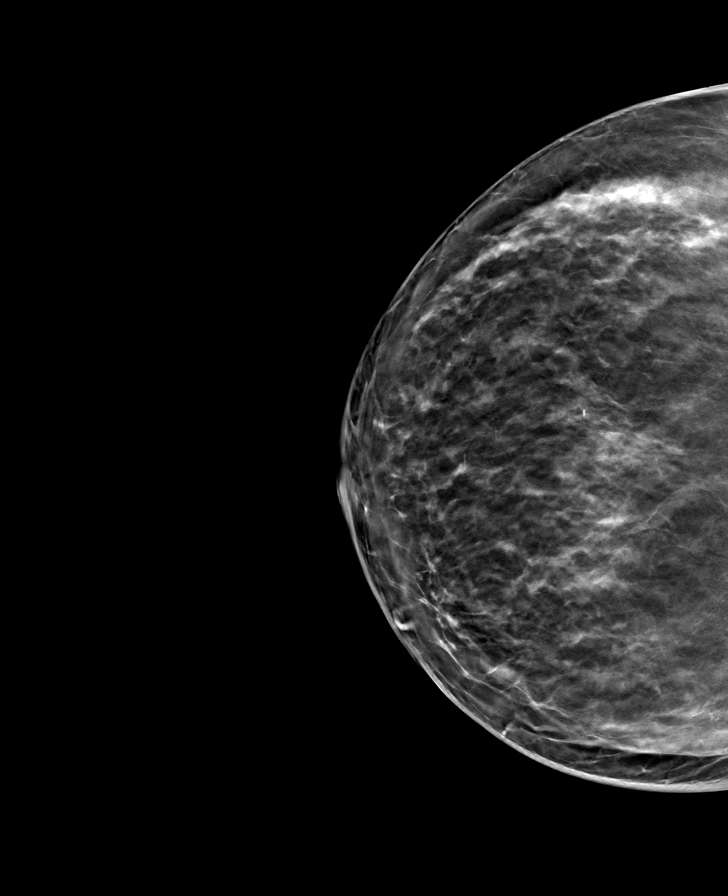

[L MLO tomo · tomo slice 38/75.0]
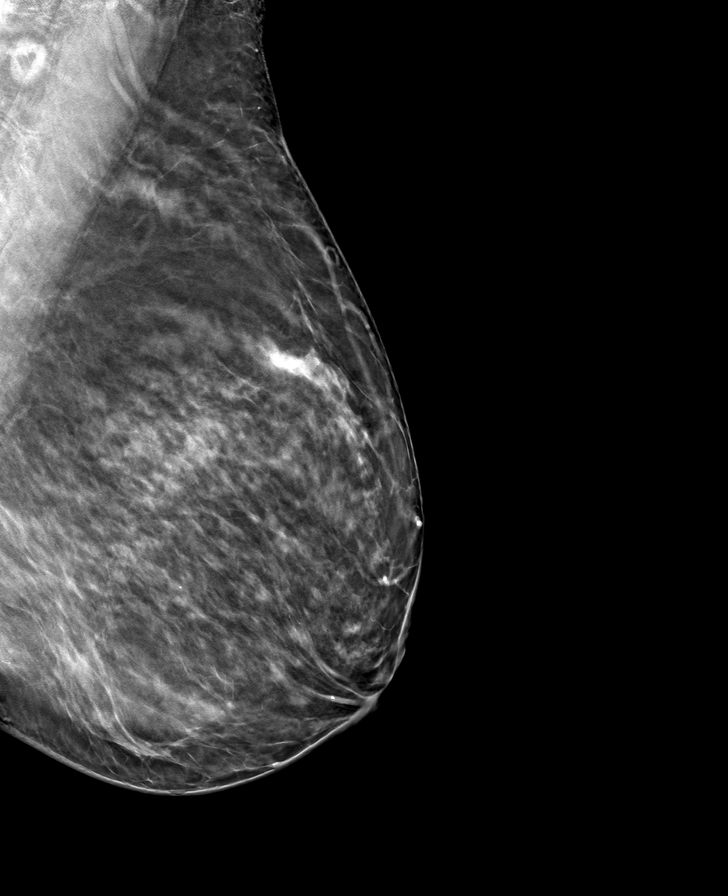

[L CC tomo · tomo slice 36/71.0]
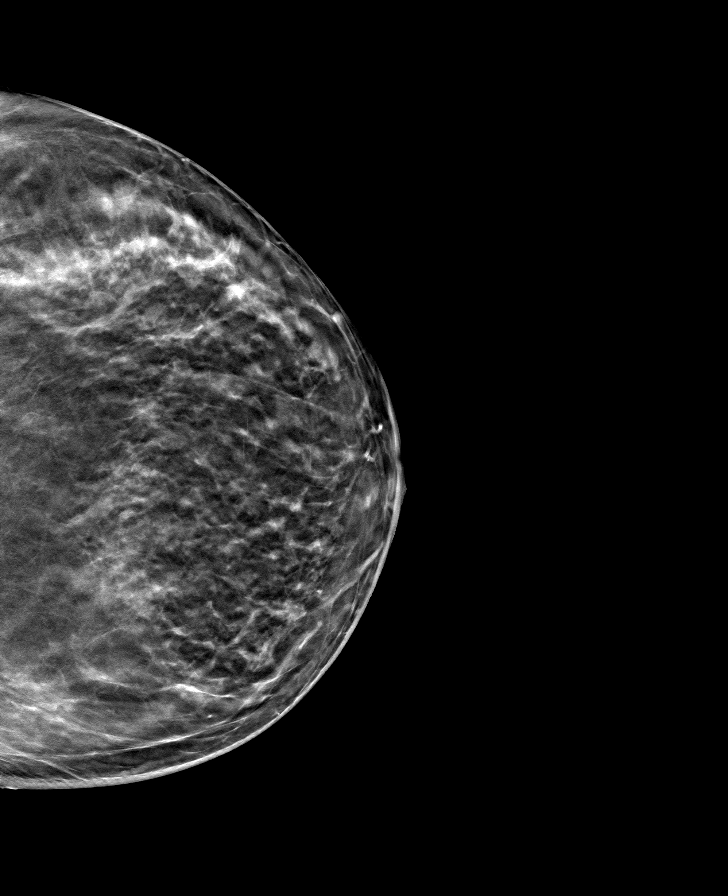

[8 of 24 positions shown; findings below may reference images not displayed]

ACR Breast Density Category b: There are scattered areas of
fibroglandular density.
FINDINGS: There are no findings suspicious for malignancy. Images were
processed with CAD.
IMPRESSION: No mammographic evidence of malignancy. A result letter of this
screening mammogram will be mailed directly to the patient.

RECOMMENDATION:
Screening mammogram in one year. (Code:[id number])

BI-RADS CATEGORY  1: Negative.
